# Patient Record
Sex: Female | Born: 1960 | Race: White | Hispanic: No | Marital: Married
Health system: Midwestern US, Community
[De-identification: ages and names within clinical notes are randomized; demographics above are authoritative.]

## PROBLEM LIST (undated history)

## (undated) DIAGNOSIS — R928 Other abnormal and inconclusive findings on diagnostic imaging of breast: Secondary | ICD-10-CM

## (undated) DIAGNOSIS — N63 Unspecified lump in unspecified breast: Secondary | ICD-10-CM

## (undated) DIAGNOSIS — Z1231 Encounter for screening mammogram for malignant neoplasm of breast: Secondary | ICD-10-CM

## (undated) DIAGNOSIS — C50412 Malignant neoplasm of upper-outer quadrant of left female breast: Principal | ICD-10-CM

---

## 2015-06-12 ENCOUNTER — Encounter

## 2015-06-13 ENCOUNTER — Encounter

## 2015-06-13 ENCOUNTER — Inpatient Hospital Stay: Admit: 2015-06-12

## 2015-06-13 DIAGNOSIS — Z1231 Encounter for screening mammogram for malignant neoplasm of breast: Secondary | ICD-10-CM

## 2015-06-18 ENCOUNTER — Inpatient Hospital Stay: Admit: 2015-06-18

## 2015-06-18 ENCOUNTER — Encounter

## 2015-06-18 DIAGNOSIS — R928 Other abnormal and inconclusive findings on diagnostic imaging of breast: Secondary | ICD-10-CM

## 2015-06-19 ENCOUNTER — Encounter

## 2015-06-29 ENCOUNTER — Encounter

## 2015-06-29 ENCOUNTER — Inpatient Hospital Stay: Admit: 2015-06-29

## 2015-06-29 DIAGNOSIS — R928 Other abnormal and inconclusive findings on diagnostic imaging of breast: Secondary | ICD-10-CM

## 2015-06-29 DIAGNOSIS — N63 Unspecified lump in unspecified breast: Secondary | ICD-10-CM

## 2015-07-13 ENCOUNTER — Institutional Professional Consult (permissible substitution): Admit: 2015-07-13 | Discharge: 2015-07-13 | Payer: PRIVATE HEALTH INSURANCE | Attending: Surgery

## 2015-07-13 DIAGNOSIS — Z7689 Persons encountering health services in other specified circumstances: Secondary | ICD-10-CM

## 2015-07-15 NOTE — Progress Notes (Signed)
Patient Name: Karen Bright  Date of Birth:   1960-09-17  Primary Care Physician: Gilman Schmidt, NP    Chief Complaint:  Patient presents secondary to newly diagnosed left breast cancer.  She underwent her annual screening mammogram at which time the lesion was noted.  She has never undergone breast imaging before.  She states she has no complaints related to her breasts prior to her imaging.   She denies any bilateral palpable masses/lesions.  She denies any bilateral skin changes/erythema/thickening/dimpling.  She denies any nipple changes/retraction or discharge bilaterally.  She underwent two biopsies at the time of her biopsy.  A ultrasound guided core biopsy which was returned as cancer and a stereotactic biopsy which was returned benign.  In addition, at the time of her imaging she was found to have abnormal right breast imaging and a six month follow up was recommended.    She has no family history of breast and/or ovarian cancer.  But she does have an extensive family history of prostate cancer.  Prostate cancer history: father age 76, brother age 37, brother age 57, 2 paternal uncles age 84's-80's       Breast History:  Personal History of Breast Cancer: no  History of High Risk Lesion: no  History of Benign Breast Lesion:no  History of Previous Breast Biopsy:no  Self Breast Exams Completed:yes-monthly  Family History of Breast Cancer:no  Family History of Other Cancers:yes-see above; mother sarcoma age 78  Ashkenazi Jewish Decent:no  Bra Size: 25 DD    Gyne History:  Gravida: 2, Para: 0, Spont: 0, Therapeutic: 2  Age of Menarche: 73 years  Age of Menopause: 2013 years   LMP: N/A  Are periods regular: N/A  Age of first pregnancy: N/A  Age of first live Birth: N/A  Breast Feeding (total time): N/A  History of Hysterectomy / TAH-BSO: N0  History of OCP's/HRT:No  Last PAP: 2014  History of Ovarian Cancer: no  Family History of Ovarian Cancer: no  History of Gyne Surgery: no    No past medical history on  file.    No past surgical history on file.       No current outpatient prescriptions on file.     No current facility-administered medications for this visit.        Social History     Social History   ??? Marital status: Married     Spouse name: N/A   ??? Number of children: N/A   ??? Years of education: N/A     Occupational History   ??? Not on file.     Social History Main Topics   ??? Smoking status: Former Smoker     Types: Cigarettes     Quit date: 1985   ??? Smokeless tobacco: Never Used   ??? Alcohol use Not on file   ??? Drug use: Not on file   ??? Sexual activity: Not on file     Other Topics Concern   ??? Not on file     Social History Narrative   ??? No narrative on file       OBJECTIVE:    Vitals:   BP 112/78 (Site: Left Arm)   Ht 5' 5"  (1.651 m)   Wt 219 lb (99.3 kg)   Breastfeeding? No   BMI 36.44 kg/m2            ROS  Constitutional: no weight loss, fever, night sweats   Skin: negative  Cardiovascular: no chest pain  or palpitations   Pulmonary: No cough, sputum, or hemoptysis   GI:No abdominal pain  Breast: see above  All other systems were reviewed and are negative    Exam  Breast: Bilateral breasts are symmetrical. The bilateral nipples are everted without discharge. The skin bilaterally is without erythema/thickening (peau d'orange)/dimpling. There are no palpable masses/lesions in the right breast.  In the left breast at the 1:30 o'clock position approximately 8 cm from the nipple is a palpable mass measuring 1 x 1 cm.  There is a well healed biopsy incision at the 1 o'clock position 9 cm form the nipple.    Lymphatics: no palpable cervical, supraclavicular, or bilateral axillary lymphadenopathy     General appearance: alert, well appearing, and in no distress  Skin: normal coloration and turgor, no rashes, no suspicious skin lesion noted  Neck: supple, no thyromegaly  Lungs: clear to auscultation, no wheezes rales or rhonchi, symmetric air entry  Heart: normal rate, regular rhythm, normal S1, S2, no murmurs, rubs,  clicks or gallops  Abdomen: soft, non-tender, non-distended, no masses or organomegaly  Extremities: bilateral upper extremities without edema    Pathology:  FINAL DIAGNOSIS:    ???? A. Left breast tissue, stereotactic core biopsy for indeterminate  ???? ?? calcifications:  ???? - Fibrocystic changes, primarily variably prominent stromal fibrosis,  ???? ?? with numerous and focally concentrated classic acinar  ???? ?? microcalcifications (negative for malignancy and significant  ???? ?? atypia).    ???? B. Left breast tissue, core biopsy of 7 mm nodule at 2:00, 8 cm from  ???? ?? nipple:  ???? - Invasive ductal carcinoma; see Comment.    COMMENT: ??The carcinoma invades several of the cores and approximately  50% of the overall sample, primarily as variably interconnected and/or  irregular nests of cells, with a tiny minority population of tubules, in  a focally desmoplastic/reactive/fibroelastotic background. Grade 2 nuclei  and negligible mitoses confer an overall grade 2 appearance, with formal  grading recommended on excision, if performed. A minor component of  cytologically similar-appearing ductal carcinoma in situ (DCIS) cannot be  completely excluded on H&E stains. A full panel of breast biomarkers will  be performed on block B3, those results to be reported as a supplement.  Slide B-3-1 has been prospectively reviewed by another member of the  Pathology Department. ??BRATI/BRATI    ADDENDUM:  BREAST CANCER BIOMARKERS  Below are results for prognostic/predictive factors on this patient's  breast cancer. All factors were assessed semiquantitatively by  immunohistochemistry (IHC).    TUMOR SITE/BLOCK: B3    ESTROGEN RECEPTOR: Positive  ???? ?? Proportion score = 5/5  ???? ?? Intensity score = 2-3/3 (most nuclei 3+ of 3)    PROGESTERONE RECEPTOR: Positive  ???? ?? Proportion score = 5/5  ???? ?? 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 Clinically, she is Stage 1 T1 , N0, Mx. I explained this may change based on pathology, but this is the clinical staging with the current  information available.  3.Status post left breast ultrasound guided core biopsy 06/29/15  4.Status post left breast stereotactic guided core biopsy 06/29/15 - benign  5.Abnormal right breast imaging    Plan:  1. I met with the patient and her friend and discussed extensively surgical and treatment options, spending over 90 minutes with them. I used diagrams to demonstrate how breast cancer develops. The patient was given a "patient guide" binder which was also reviewed in our consultation today.    We discussed partial mastectomy (lumpectomy/ breast conserving surgery) combined with post-op radiation therapy, vs mastectomy with and without reconstruction. We reviewed the risks (including but not limited to: bleeding, infection, recurrence, missing lesion, damage to surrounding structures, need for further surgery, continued pain, poor/non wound healing, scar, deformity of breast and/or nipple, nipple necrosis) and benefits of the surgeries.  I also explained the potential need for additional surgery if margins were not clear on partial mastectomy.  We also discussed combining partial mastectomy with bilateral breast reduction surgery.      I explained the indications and benefits/risks of sentinel lymph node biopsy (SLNB). I also discussed the possible morbidities related to lymph node surgery (including but not limited to: bleeding, infection, damage to surrounding structures, need  for further surgery, pain, paresthesias, numbness, shoulder dysfunction, arm dysfunction, scapular dysfunction, range of motion problems, lymphedema) with the possible need for axillary lymph node dissection pending the SLNB results.       We briefly discussed the possible indications for chemotherapy post-operatively based on final pathology results, as well as the recommendation for hormone therapy with estrogen positive disease receptor post-operatively. I explained I will plan for her to see medical oncologist, Dr. Caleen Essex.        I also explained the need for radiation therapy should a partial mastectomy be completed at the time of surgery.  We also reviewed the most common indications for post mastectomy radiation therapy including positive lymph node disease, large tumors (greater than 5 cm), and margin involvement.At this time the patient feels she most likely will undergo bilateral mastectomy.  Therefore, a referral to radiation oncology will be placed in the future should one be required.    I have recommended the patient undergo genetic counseling and possibly testing for BRCA mutation secondary to her family history of prostate cancer. I have discussed with the patient that as high as 10% of breast cancers may have a genetic component. And, in addition having a mutation in the BRCA gene also places her at risk for the development of ovarian cancer. I have also informed her that this may alter her surgical decision planning therefore, if she would like to pursue genetic testing and it would in any way alter her surgical decision making I would recommend this be completed prior to her breast surgery.      She has opted for mastectomy and sentinel lymph node biopsy, which has been scheduled after speaking with plastic surgery as the patient will have immediate first stage reconstruction at the time of her mastectomies.      Finally, the patient was provided with local support group information for  her use should she choose.    2.  Referral to medical oncology, Caleen Essex    3.  Referral to genetics    4.  Referral to plastic surgery, Allision Lied    5.  Oncotype Dx has been sent.  I have explained to the patient that there is a test called Oncotype Dx which  can be completed on her tumor to help the medical oncologist to determine her need for chemotherapy.  I have explained the text and that it determines her a recurrence score which the medical oncologist can use along with other information to help make recommendations for her therapy.  The patient is agreeable to having this test completed.      6.  MRI ordered    7.  Right mammogram and ultrasound will be needed in six months    8.  Upper extremity measurements were taken and documented    9.  Patient will need medical clearance for surgery    10.  Patient was given a pre-operative instruction sheet which was reviewed with her by nursing/MA    11.  Return to clinic two weeks post op, sooner if needed    12. Continue yearly mammograms    13.  Continue monthly self breast exams    14  Thank you for allowing me to participate in the care of your patient.       A total of 90 minutes was spent with this patient, 80 minutes were spent counseling

## 2015-07-16 NOTE — Telephone Encounter (Signed)
NP DX. DR. Kary Kos [phone] [facility] MRI SCHEDULED 06.14.2017 BIOPS 05.19.2017 MAMMO 05.03.2017 05.09.2017 ALL WITH REFERRING MD. PATIENT GAVE VERBAL CONSENT TO OBTAIN RECORDS. PACKET MAILED. PATIENT REQUESTED LOCATION/DATE. APPOINTMENT 06.20.2017 AT 11:00. Karen Bright

## 2015-07-16 NOTE — Telephone Encounter (Signed)
Noted  

## 2015-07-25 ENCOUNTER — Inpatient Hospital Stay: Admit: 2015-07-25 | Attending: Surgery

## 2015-07-25 DIAGNOSIS — C50412 Malignant neoplasm of upper-outer quadrant of left female breast: Secondary | ICD-10-CM

## 2015-07-25 MED ORDER — GADOTERIDOL 279.3 MG/ML IV SOLN
279.3 MG/ML | Freq: Once | INTRAVENOUS | Status: AC | PRN
Start: 2015-07-25 — End: 2015-07-25
  Administered 2015-07-25: 21:00:00 20 mL via INTRAVENOUS

## 2015-07-30 NOTE — Telephone Encounter (Signed)
Records are in the patients chart.  The 06.14.17 MRI is in process, per [facility].

## 2015-07-31 ENCOUNTER — Ambulatory Visit: Admit: 2015-07-31 | Discharge: 2015-07-31 | Payer: PRIVATE HEALTH INSURANCE | Attending: Surgery

## 2015-07-31 ENCOUNTER — Inpatient Hospital Stay: Attending: Surgery

## 2015-07-31 ENCOUNTER — Ambulatory Visit: Admit: 2015-07-31 | Discharge: 2015-07-31 | Payer: PRIVATE HEALTH INSURANCE | Attending: Medical Oncology

## 2015-07-31 DIAGNOSIS — C50412 Malignant neoplasm of upper-outer quadrant of left female breast: Secondary | ICD-10-CM

## 2015-07-31 DIAGNOSIS — Z7189 Other specified counseling: Secondary | ICD-10-CM

## 2015-07-31 NOTE — Telephone Encounter (Signed)
Pt says that she has a few questions regarding her surgery. Pt wants to know if she can stop by the office today to speak with someone since she has an oncology appointment and will be in the area, please contact pt.

## 2015-07-31 NOTE — Progress Notes (Signed)
HEMATOLOGY/ONCOLOGY CONSULTATION:     08/05/2015 1:59 PM    REASON FOR CONSULT:     PROVIDERS:  Gilman Schmidt, NP    CHIEF COMPLAINT:     Chief Complaint   Patient presents with   ??? 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 Clinically, she is Stage 1 T1 , N0, Mx. Status post left breast ultrasound guided core biopsy 06/29/15.  Status post left breast stereotactic guided core biopsy 06/29/15 - benign.  Oncotype Dx 10 .  Plan for definitive surgery 08/09/15.  Plan for bilateral mastectomy with immediate reconstruction with Dr Candice Camp  Will have a SNLB as well    ??  PAST MEDICAL HISTORY:     Past Medical History:   Diagnosis Date   ??? Cancer ([place])    ??? COPD (chronic obstructive pulmonary disease) ([place])    ??? COPD, mild ([facility])-dx PFT's [place] 2017 08/03/2015       PAST SURGICAL HISTORY:        Past Surgical History:   Procedure Laterality Date   ??? APPENDECTOMY  1971   ??? BREAST BIOPSY  2017   ??? TOE AMPUTATION Right 2013    right great toe with node removal       SOCIAL HISTORY:     Social History     Social History   ??? Marital status: Married     Spouse name: N/A   ??? Number of children: N/A   ??? Years of education: N/A     Occupational History   ??? Not on file.     Social History Main Topics   ??? Smoking status: Former Smoker     Types: Cigarettes     Quit date: 1985   ??? Smokeless tobacco: Never Used   ??? Alcohol use Yes   ??? Drug use: No   ??? Sexual activity: Not on file     Other Topics Concern   ??? Not on file     Social History Narrative    Exercises regularly        Lives in [place]:     Family History   Problem Relation Age of Onset   ??? Diabetes Mother    ??? Cancer Mother      Breast   ??? Cancer Father 52     Prostate   ??? Cancer Brother 32     Prostate   ??? Cancer Maternal Grandmother 102     Colon    ??? Other Maternal Grandfather 70      Emphysema   ??? Cancer Brother 33     Prostate and Melanoma   ??? Cancer Brother      Melanoma       ALLERGIES:     Allergies as of 07/31/2015   ??? (No Known Allergies)       MEDICATIONS:     No current outpatient prescriptions on file prior to visit.     No current facility-administered medications on file prior to visit.        ROS:     Pertinent ROS in HPI  Otherwise 10 point ROS in normal      PHYSICAL EXAM:       BP 118/80   Pulse 60  Temp 98.6 ??F (37 ??C)   Resp 12   Ht 5' 4.96" (1.65 m)   Wt 217 lb 6.4 oz (98.6 kg)   BMI 36.22 kg/m2    General appearance: alert and cooperative  Head: Normocephalic, without obvious abnormality, atraumatic no scleral icterus  Neck: No palpable lymphadenopathy in supraclavicular or cervical chains  Lungs: Clear to auscultation bilaterally, no audible rales, wheezes or crackles  Heart: Regular rate and rhythm, S1, S2 normal no JVD  Abdomen: Soft, non-tender; bowel sounds normal; no masses,  no organomegaly  Extremities: without cyanosis, clubbing, edema or asymmetry  Skin: No jaundice, purpura or petechiae  Neuro: A/Ox3, gait normal ,neurologically intact  R great toe amputation     LABS:     Lab Results   Component Value Date    WBC 8.7 08/03/2015    HGB 14.7 08/03/2015    HCT 45.0 08/03/2015    MCV 90.5 08/03/2015    PLT 232 08/03/2015       Lab Results   Component Value Date    GLUCOSE 94 08/03/2015    BUN 12 08/03/2015    CREATININE 0.6 08/03/2015    K 4.7 08/03/2015       Lab Results   Component Value Date    ALKPHOS 75 08/03/2015    AST 18 08/03/2015       No results found for: MG    No results found for: URICACID    No results found for: LDH    No components found for: PT,  INR    No results found for: IRON, TIBC, FERRITIN    PATHOLOGY         ???? A. Left breast tissue, stereotactic core biopsy for indeterminate  ???? ?? calcifications:  ???? - Fibrocystic changes, primarily variably prominent stromal fibrosis,  ???? ?? with numerous and focally concentrated classic acinar  ???? ??  microcalcifications (negative for malignancy and significant  ???? ?? atypia).    ???? B. Left breast tissue, core biopsy of 7 mm nodule at 2:00, 8 cm from  ???? ?? nipple:  ???? - Invasive ductal carcinoma; see Comment.    COMMENT: ??The carcinoma invades several of the cores and approximately  50% of the overall sample, primarily as variably interconnected and/or  irregular nests of cells, with a tiny minority population of tubules, in  a focally desmoplastic/reactive/fibroelastotic background. Grade 2 nuclei  and negligible mitoses confer an overall grade 2 appearance, with formal  grading recommended on excision, if performed. A minor component of  cytologically similar-appearing ductal carcinoma in situ (DCIS) cannot be  completely excluded on H&E stains. A full panel of breast biomarkers will  be performed on block B3, those results to be reported as a supplement.  Slide B-3-1 has been prospectively reviewed by another member of the  Pathology Department. ??BRATI/BRATI   ADDENDUM:  BREAST CANCER BIOMARKERS  Below are results for prognostic/predictive factors on this patient's  breast cancer. All factors were assessed semiquantitatively by  immunohistochemistry (IHC).    TUMOR SITE/BLOCK: B3    ESTROGEN RECEPTOR: Positive  ???? ?? Proportion score = 5/5  ???? ?? Intensity score = 2-3/3 (most nuclei 3+ of 3)    PROGESTERONE RECEPTOR: Positive  ???? ?? Proportion score = 5/5  ???? ?? Intensity score = 3/3    ERBB2/HER2/neu: Negative; score = 1+ (weak) of 3    MIB1 PROLIFERATION RATE: Borderline increased (score = 25%)  ??  ????  IMAGING:     No  results found.    STAGING:     Breast cancer of upper-outer quadrant of left female breast ([place])    Staging form: Breast, AJCC 7th Edition    - Clinical: Stage IA (T1c, N0, M0) - Unsigned    ASSESSMENT Kathyrn Lass:       Encounter Diagnoses   Name Primary?   ??? Breast cancer of upper-outer quadrant of left female breast ([place]) Yes   ??? ER+ (estrogen receptor positive status)        Left breast cancer T1 cN0  M0 stage I A ER PR positive HER-2/neu negative Oncotype DX score below 10.  She will proceed with surgery the following weeks.  Patient return after surgery discuss adjuvant endocrine therapy.  There is no role for chemotherapy given her low Oncotype DX.  She is having a mastectomy so not likely need adjuvant radiation therapy.  Did discuss briefly endocrine therapy in terms of side effects but will discuss to discuss further upon her return after surgery.    Would refer to genetic counselor given her family history and personal history of ALM  melanoma or right great toe               Hazle Quant, MD    ??  This chart was generated in part by using Dragon Dictation system and may contain errors related to that system including errors in grammar, punctuation, and spelling, as well as words and phrases that may be inappropriate. If there are any questions or concerns please feel free to contact the dictating provider for clarification.   ??

## 2015-07-31 NOTE — Telephone Encounter (Signed)
Pt added on the schedule for today @ 1:50pm

## 2015-08-01 NOTE — Progress Notes (Signed)
Patient presents for consultation only.  She has several questions related to mastectomy and surgical course  Spent 30 minutes with patient answering her questions.  All questions answered  Patient scheduled for surgery next week.  She had questions related to mastectomy and sentinel lymph node biopsy.  Patient is going to proceed with bilateral mastectomy and sentinel lymph node biopsy followed by reconstruction to be completed by Dr. Candice Camp.    A total of 30 minutes was spent face to face/involved with this patient, All spent counseling

## 2015-08-02 NOTE — Progress Notes (Signed)
[facility] / [facility] [address] [place], [place] [address]    Acknowledgment of Informed Consent for Surgical or Medical Procedure and Sedation  I agree to allow doctor(s) [place] and his/her associates or assistants, including residents and/or other qualified medical practitioner to perform the following medical treatment or procedure and to administer or direct the administration of sedation as necessary:  Procedure(s): BILATERAL BREAST MASTECTOMY, LEFT BREAST SENTINEL [place], STAGE 1 RECONSTRUCTION BILATERAL TISSUE EXPANDERS WITH ALLODERM AND ON Q [place]  My doctor has explained the following regarding the proposed procedure:  ??? the explanation of the procedure  ??? the benefits of the procedure  ??? the potential problems that might occur during recuperation  ??? the risks and side effects of the procedure which could include but are not limited to severe blood loss, infection, stroke or death  ??? the benefits, risks and side effect of alternative procedures including the consequences of declining this procedure or any alternative procedures  ??? the likelihood of achieving satisfactory results.  I acknowledge no guarantee or assurance has been made to me regarding the results.    I understand that during the course of this treatment/procedure, unforeseen conditions can occur which require an additional or different procedure.  I agree to allow my physician or assistants to perform such extension of the original procedure as they may find necessary.    I understand that sedation will often result in temporary impairment of memory and fine motor skills and that sedation can occasionally progress to a state of deep sedation or general anesthesia.    I understand the risks of anesthesia for surgery include, but are not limited to, sore throat, hoarseness, injury to face, mouth, or teeth; nausea; headache;  injury to blood vessels or nerves; death, brain damage, or paralysis.    I understand that if I have a Limitation of Treatment order in effect during my hospitalization, the order may or may not be in effect during this procedure.     I give my doctor permission to give me blood or blood products.  I understand that there are risks with receiving blood such as hepatitis, AIDS, fever, or allergic reaction.  I acknowledge that the risks, benefits, and alternatives of this treatment have been explained to me and that no express or implied warranty has been given by the hospital, any blood bank, or any person or entity as to the blood or blood components transfused.    At the discretion of my doctor, I agree to allow observers, equipment/product representatives and allow photographing, and/or televising of the procedure, provided my name or identity is maintained confidentially.      I agree the hospital may dispose of or use for scientific or educational purposes any tissue, fluid, or body parts which may be removed.    ________________________________Date________Time______ am/pm  ([place])  Patient or Signature of Closest Relative or Legal Guardian    ________________________________Date________Time______am/pm      Page 1 of ____  Witness

## 2015-08-03 ENCOUNTER — Ambulatory Visit: Admit: 2015-08-03 | Discharge: 2015-08-03 | Payer: PRIVATE HEALTH INSURANCE | Attending: Internal Medicine

## 2015-08-03 DIAGNOSIS — Z01818 Encounter for other preprocedural examination: Secondary | ICD-10-CM

## 2015-08-03 NOTE — Progress Notes (Signed)
Karen Bright is a 55 y.o.  female  Presenting today for a preop visit.  she has scheduled bilateral mastectomy  Procedure indicated for breast cancer  Procedure will  be performed by Dr. Kary Kos  on 08/09/2015 at [facility]    No Known Allergies          Past Surgical History:   Procedure Laterality Date   ??? APPENDECTOMY  1971   ??? BREAST BIOPSY  2017   ??? TOE AMPUTATION Right 2013    right great toe with node removal       Patient Active Problem List   Diagnosis   ??? Breast cancer of upper-outer quadrant of left female breast ([place])       Previous steroids no  Personal or family hx of prolonged bleeding no  Personal or family hx of adverse reaction to anesthesia no  Immunization deficiency none    Patient's medications, allergies, past medical, surgical, social and family histories were reviewed and updated as appropriate.    Complete ROS negative    BP 100/68 (Site: Right Arm, Position: Sitting, Cuff Size: Large Adult)   Pulse 83   Temp 98.5 ??F (36.9 ??C) (Oral)    Ht 5' 2.99" (1.6 m)   Wt 217 lb (98.4 kg)   SpO2 93%   BMI 38.45 kg/m2  Gen: well nourished, comfortable  Nose: Normal mucosa, no deformity   Mouth/Throat: Oropharynx is clear and moist, and mucous membranes are normal. Normal dentition, no concerning lesions  Eyes: Conjunctivae and extraocular motions are normal. Pupils are equal, round, and reactive to light.   Ears: normal canals and TM's bilaterally  Neck: Neck supple. No mass  Lymph: no cervical or supraclavicular adenopathy  Cardiovascular: S1S2 with no murmurs, rubs or gallops  Pulmonary/Chest: Effort normal and breath sounds normal. . She has no wheezes, rhonchi or rales.   Abdominal: Soft, non-tender. Bowel sounds are normal. She exhibits no organomegaly or mass  Genitourinary: deferred  Musculoskeletal: Normal range of motion, no synovitis.  Neurological:  CN 2-12 grossly intact   Coordination normal. Normal tone  Skin: Skin is warm and dry. There is no rash or erythema.  No  suspicious lesions noted.   Psychiatric: behavior appropriate for age     Assessment and Plan    Low risk patient may proceed with moderate risk procedure  No nsaids for 7 days prior to surgery.  Nothing but tylenol  ecg with poor rwave progression,  NSR  Vigorous exercise regularly 7 MET-no cardiac concerns

## 2015-08-04 LAB — COMPREHENSIVE METABOLIC PANEL
ALT: 21 U/L (ref 10–40)
AST: 18 U/L (ref 15–37)
Albumin/Globulin Ratio: 2.1 (ref 1.1–2.2)
Albumin: 4.9 g/dL (ref 3.4–5.0)
Alkaline Phosphatase: 75 U/L (ref 40–129)
Anion Gap: 17 — ABNORMAL HIGH (ref 3–16)
BUN: 12 mg/dL (ref 7–20)
CO2: 25 mmol/L (ref 21–32)
Calcium: 10.1 mg/dL (ref 8.3–10.6)
Chloride: 98 mmol/L — ABNORMAL LOW (ref 99–110)
Creatinine: 0.6 mg/dL (ref 0.6–1.1)
GFR African American: >60 (ref >60)
GFR Non-African American: >60 (ref >60)
Globulin: 2.3 g/dL
Glucose: 94 mg/dL (ref 70–99)
Potassium: 4.7 mmol/L (ref 3.5–5.1)
Sodium: 140 mmol/L (ref 136–145)
Total Bilirubin: 0.3 mg/dL (ref 0.0–1.0)
Total Protein: 7.2 g/dL (ref 6.4–8.2)

## 2015-08-04 LAB — CBC WITH AUTO DIFFERENTIAL
Basophils %: 0.5 %
Basophils Absolute: 0 10*3/uL (ref 0.0–0.2)
Eosinophils %: 0.4 %
Eosinophils Absolute: 0 10*3/uL (ref 0.0–0.6)
Hematocrit: 45 % (ref 36.0–48.0)
Hemoglobin: 14.7 g/dL (ref 12.0–16.0)
Lymphocytes %: 27.7 %
Lymphocytes Absolute: 2.4 10*3/uL (ref 1.0–5.1)
MCH: 29.6 pg (ref 26.0–34.0)
MCHC: 32.7 g/dL (ref 31.0–36.0)
MCV: 90.5 fL (ref 80.0–100.0)
MPV: 9.7 fL (ref 5.0–10.5)
Monocytes %: 6.7 %
Monocytes Absolute: 0.6 10*3/uL (ref 0.0–1.3)
Neutrophils %: 64.7 %
Neutrophils Absolute: 5.6 10*3/uL (ref 1.7–7.7)
Platelets: 232 10*3/uL (ref 135–450)
RBC: 4.97 M/uL (ref 4.00–5.20)
RDW: 13.5 % (ref 12.4–15.4)
WBC: 8.7 10*3/uL (ref 4.0–11.0)

## 2015-08-07 ENCOUNTER — Encounter

## 2015-08-07 ENCOUNTER — Inpatient Hospital Stay: Admit: 2015-08-07 | Attending: Surgery

## 2015-08-07 DIAGNOSIS — C50412 Malignant neoplasm of upper-outer quadrant of left female breast: Secondary | ICD-10-CM

## 2015-08-08 ENCOUNTER — Inpatient Hospital Stay: Admit: 2015-08-08 | Attending: Surgery

## 2015-08-08 NOTE — Progress Notes (Signed)
We have all necessary testing for patient

## 2015-08-08 NOTE — Anesthesia Pre-Procedure Evaluation (Signed)
ANESTHESIA PRE-OP NOTE    NAME: Karen Bright  DOB: 1960/05/07  AGE: 55 y.o.  MED. REC. #: [id number]  DOS: 08/09/15  TIME: 8:30     PROCEDURE:  BILATERAL BREAST MASTECTOMY, LEFT BREAST SENTINEL LYMPH NODE BIOPSY POSSIBLE AXILLARY LYMPH NODE DISSECTION, STAGE 1 RECONSTRUCTION BILATERAL TISSUE EXPANDERS WITH ALLODERM   SURGEON: Shapiro-Wright / Lied     ALLERGIES: none     HT: 5'3"   WT: 217 lbs    MEDICATIONS:   TRIAMCINOLONE   ASA STATUS: 2  NPO since: midnight   Patient identified/chart reviewed: yes     PSH:  Appendectomy (1971); Breast biopsy (2017); and Toe amputation (Right, 2013).    PERSONAL/FAMILY ANESTHESIA PROBLEMS: ( - )       CV: EKG-->NORMAL SINUS RHYTHM; OCCASIONAL PALPITATIONS (ECHO-->NO SIGNIFICANT ABNORMALITIES PULMONARY: MILD COPD-->noted on spirometry, minimal symptoms; FORMER SMOKER   GI/HEPATIC: ( - ) GERD  ENDOCRINE: ( - ) DM   NEURO/PSYCH: ALERT AND ORIENTED GU: ( - )    MUSCULOSKELETAL: ( - )  OTHER: OBESITY   HEME/ONC: LEFT BREAST CANCER COMMENTS:     AIRWAY ASSESSMENT:  MALLAMPATI: 2 DENTITION: INTACT ROM: FULL     ANESTHETIC PLAN: GENERAL with IV Induction   CONSENT: Risks/benefits/options/questions discussed with patient and/or patient representative.Consent obtained: Rupert Stacks, M.D. August 09, 2015  6:44 AM

## 2015-08-08 NOTE — Progress Notes (Signed)
PRE-OP INSTRUCTIONS FOR THE SURGICAL PATIENT YOU ARE UNABLE TO MAKE CONTACT FOR AN INTERVIEW:      1. Follow instructions for your ARRIVAL TIME as DIRECTED BY YOUR SURGEON.   2. Enter the MAIN entrance located on [address] and report to the desk.   3. Bring your insurance & prescription card and photo ID with you.    4. Leave all other valuables at home.               5. Arrange for someone to drive you home and be with you for the first 24 hours after discharge.  6. You must contact your surgeon for ALL medication instructions, especially if taking blood thinners, aspirin, or diabetic medication.  7. A Pre-op History and Physical for surgery MUST be completed by your Physician or an Urgent Care within 30 days of your procedure date.  Please bring a copy with you on the day of your procedure and along with any other testing performed.  8. DO NOT EAT OR DRINK ANYTHING AFTER MIDNIGHT, including gum, candy, mints or ice chips   9. Dress in loose, comfortable clothing appropriate for redressing after your procedure. Do not wear jewelry (including body piercings), make-up, fingernail polish, lotion, powders or metal hairclips. Contacts will need to be removed prior to surgery.  10. If you use a CPAP, please bring it with you on the day of your procedure.  11. Do not shave or wax for 72 hours prior to procedure near your operative site  12. FOR WOMAN OF CHILDBEARING AGE ONLY- please bring a urine sample with you on day of surgery or make sure we can collect on arrival.    If you have further questions, you may contact us at [phone]    Left instructions on patient's voicemail.    Vivi Martens.08/08/2015 .1:33 PM

## 2015-08-09 ENCOUNTER — Observation Stay: Admit: 2015-08-09

## 2015-08-09 ENCOUNTER — Inpatient Hospital Stay
Admit: 2015-08-09 | Discharge: 2015-08-10 | Disposition: A | Discharge status: Home / Self Care | Payer: PRIVATE HEALTH INSURANCE | Source: Ambulatory Visit | Attending: Surgery | Admitting: Surgery

## 2015-08-09 ENCOUNTER — Encounter: Admit: 2015-08-09 | Payer: PRIVATE HEALTH INSURANCE | Attending: Surgery

## 2015-08-09 DIAGNOSIS — C50912 Malignant neoplasm of unspecified site of left female breast: Principal | ICD-10-CM

## 2015-08-09 MED ORDER — NORMAL SALINE FLUSH 0.9 % IV SOLN
0.9 % | Freq: Two times a day (BID) | INTRAVENOUS | Status: DC
Start: 2015-08-09 — End: 2015-08-09

## 2015-08-09 MED ORDER — FENTANYL CITRATE (PF) 250 MCG/5ML IJ SOLN
250 | INTRAMUSCULAR | Status: AC
Start: 2015-08-09 — End: 2015-08-09

## 2015-08-09 MED ORDER — HYDROMORPHONE HCL 1 MG/ML IJ SOLN
1 MG/ML | INTRAMUSCULAR | Status: DC | PRN
Start: 2015-08-09 — End: 2015-08-09
  Administered 2015-08-09: 19:00:00 0.5 mg via INTRAVENOUS

## 2015-08-09 MED ORDER — PROPOFOL 200 MG/20ML IV EMUL
200 | INTRAVENOUS | Status: AC
Start: 2015-08-09 — End: 2015-08-09

## 2015-08-09 MED ORDER — HYDROMORPHONE HCL 1 MG/ML IJ SOLN
1 | INTRAMUSCULAR | Status: DC | PRN
Start: 2015-08-09 — End: 2015-08-10

## 2015-08-09 MED ORDER — MEPERIDINE HCL 25 MG/ML IJ SOLN
25 MG/ML | INTRAMUSCULAR | Status: DC | PRN
Start: 2015-08-09 — End: 2015-08-09

## 2015-08-09 MED ORDER — LUBRIFRESH P.M. OP OINT
OPHTHALMIC | Status: AC
Start: 2015-08-09 — End: 2015-08-09

## 2015-08-09 MED ORDER — SUGAMMADEX SODIUM 200 MG/2ML IV SOLN
200 | INTRAVENOUS | Status: AC
Start: 2015-08-09 — End: 2015-08-09

## 2015-08-09 MED ORDER — LIDOCAINE-PRILOCAINE 2.5-2.5 % EX CREA
CUTANEOUS | Status: AC
Start: 2015-08-09 — End: 2015-08-09
  Administered 2015-08-09: 11:00:00 via TOPICAL

## 2015-08-09 MED ORDER — LACTATED RINGERS IV SOLN
INTRAVENOUS | Status: DC
Start: 2015-08-09 — End: 2015-08-09

## 2015-08-09 MED ORDER — GENTAMICIN SULFATE 40 MG/ML IJ SOLN
40 | INTRAMUSCULAR | Status: AC
Start: 2015-08-09 — End: 2015-08-09

## 2015-08-09 MED ORDER — OXYCODONE-ACETAMINOPHEN 5-325 MG PO TABS
5-325 | ORAL | Status: DC | PRN
Start: 2015-08-09 — End: 2015-08-10
  Administered 2015-08-10 (×2): 1 via ORAL

## 2015-08-09 MED ORDER — LIDOCAINE-PRILOCAINE 2.5-2.5 % EX CREA
CUTANEOUS | Status: DC | PRN
Start: 2015-08-09 — End: 2015-08-09

## 2015-08-09 MED ORDER — DEXTROSE 5 % IV SOLN (MINI-BAG)
5 | Freq: Three times a day (TID) | INTRAVENOUS | Status: DC
Start: 2015-08-09 — End: 2015-08-10
  Administered 2015-08-09 – 2015-08-10 (×3): 1 g via INTRAVENOUS

## 2015-08-09 MED ORDER — ONDANSETRON HCL 4 MG/2ML IJ SOLN
4 | INTRAMUSCULAR | Status: AC
Start: 2015-08-09 — End: 2015-08-09

## 2015-08-09 MED ORDER — CEFAZOLIN SODIUM 1 G IJ SOLR
1 | INTRAMUSCULAR | Status: AC
Start: 2015-08-09 — End: 2015-08-09

## 2015-08-09 MED ORDER — SODIUM CHLORIDE 0.9 % IJ SOLN
0.9 | INTRAMUSCULAR | Status: AC
Start: 2015-08-09 — End: 2015-08-09

## 2015-08-09 MED ORDER — SUCCINYLCHOLINE CHLORIDE 20 MG/ML IJ SOLN
20 | INTRAMUSCULAR | Status: AC
Start: 2015-08-09 — End: 2015-08-09

## 2015-08-09 MED ORDER — LIDOCAINE HCL (PF) 1 % IJ SOLN
1 % | Freq: Once | INTRAMUSCULAR | Status: DC | PRN
Start: 2015-08-09 — End: 2015-08-09

## 2015-08-09 MED ORDER — LACTATED RINGERS IV SOLN
INTRAVENOUS | Status: DC
Start: 2015-08-09 — End: 2015-08-10
  Administered 2015-08-09 – 2015-08-10 (×3): via INTRAVENOUS

## 2015-08-09 MED ORDER — OXYCODONE-ACETAMINOPHEN 5-325 MG PO TABS
5-325 | ORAL | Status: DC | PRN
Start: 2015-08-09 — End: 2015-08-10
  Administered 2015-08-09 – 2015-08-10 (×3): 2 via ORAL

## 2015-08-09 MED ORDER — NORMAL SALINE FLUSH 0.9 % IV SOLN
0.9 % | INTRAVENOUS | Status: DC | PRN
Start: 2015-08-09 — End: 2015-08-09

## 2015-08-09 MED ORDER — ONDANSETRON HCL 4 MG/2ML IJ SOLN
4 MG/2ML | Freq: Once | INTRAMUSCULAR | Status: DC | PRN
Start: 2015-08-09 — End: 2015-08-09

## 2015-08-09 MED ORDER — DEXAMETHASONE SODIUM PHOSPHATE 4 MG/ML IJ SOLN
4 | INTRAMUSCULAR | Status: AC
Start: 2015-08-09 — End: 2015-08-09

## 2015-08-09 MED ORDER — ONDANSETRON HCL 4 MG/2ML IJ SOLN
4 | Freq: Four times a day (QID) | INTRAMUSCULAR | Status: DC | PRN
Start: 2015-08-09 — End: 2015-08-10
  Administered 2015-08-09: 22:00:00 4 mg via INTRAVENOUS

## 2015-08-09 MED ORDER — LACTATED RINGERS IV SOLN
INTRAVENOUS | Status: DC
Start: 2015-08-09 — End: 2015-08-09
  Administered 2015-08-09: 11:00:00 125 mL/h via INTRAVENOUS

## 2015-08-09 MED ORDER — MIDAZOLAM HCL 2 MG/2ML IJ SOLN
2 | INTRAMUSCULAR | Status: AC
Start: 2015-08-09 — End: 2015-08-09

## 2015-08-09 MED ORDER — HYDROMORPHONE HCL 2 MG/ML IJ SOLN
2 | INTRAMUSCULAR | Status: AC
Start: 2015-08-09 — End: 2015-08-09

## 2015-08-09 MED ORDER — NORMAL SALINE FLUSH 0.9 % IV SOLN
0.9 | INTRAVENOUS | Status: DC | PRN
Start: 2015-08-09 — End: 2015-08-10
  Administered 2015-08-09: 22:00:00 10 mL via INTRAVENOUS

## 2015-08-09 MED ORDER — FENTANYL CITRATE (PF) 100 MCG/2ML IJ SOLN
100 MCG/2ML | INTRAMUSCULAR | Status: DC | PRN
Start: 2015-08-09 — End: 2015-08-09

## 2015-08-09 MED ORDER — DIAZEPAM 5 MG PO TABS
5 MG | Freq: Four times a day (QID) | ORAL | Status: DC | PRN
Start: 2015-08-09 — End: 2015-08-10
  Administered 2015-08-09 – 2015-08-10 (×5): 5 mg via ORAL

## 2015-08-09 MED ORDER — HYDRALAZINE HCL 20 MG/ML IJ SOLN
20 MG/ML | INTRAMUSCULAR | Status: DC | PRN
Start: 2015-08-09 — End: 2015-08-09

## 2015-08-09 MED ORDER — NORMAL SALINE FLUSH 0.9 % IV SOLN
0.9 | Freq: Two times a day (BID) | INTRAVENOUS | Status: DC
Start: 2015-08-09 — End: 2015-08-10

## 2015-08-09 MED ORDER — LABETALOL HCL 5 MG/ML IV SOLN
5 MG/ML | INTRAVENOUS | Status: DC | PRN
Start: 2015-08-09 — End: 2015-08-09

## 2015-08-09 MED ORDER — BACITRACIN 50000 UNITS IM SOLR
50000 | INTRAMUSCULAR | Status: AC
Start: 2015-08-09 — End: 2015-08-09

## 2015-08-09 MED ORDER — EPHEDRINE SULFATE 50 MG/ML IJ SOLN
50 | INTRAMUSCULAR | Status: AC
Start: 2015-08-09 — End: 2015-08-09

## 2015-08-09 MED ORDER — BUPIVACAINE 0.25% ELASTOMERIC INFUSION
Status: DC
Start: 2015-08-09 — End: 2015-08-10
  Administered 2015-08-09: 19:00:00 270 mL

## 2015-08-09 MED ORDER — ISOSULFAN BLUE 1 % SC SOLN
1 | SUBCUTANEOUS | Status: AC
Start: 2015-08-09 — End: 2015-08-09

## 2015-08-09 MED ORDER — FAMOTIDINE 20 MG/2ML IV SOLN
20 | INTRAVENOUS | Status: AC
Start: 2015-08-09 — End: 2015-08-09

## 2015-08-09 MED ORDER — TECHNETIUM TC 99M FILTERED SULFUR COLLOID
Freq: Once | Status: AC | PRN
Start: 2015-08-09 — End: 2015-08-09
  Administered 2015-08-09: 12:00:00 800 via INTRAVENOUS

## 2015-08-09 MED ORDER — CEFAZOLIN 2000 MG D5W 50 ML IVPB
Freq: Once | Status: AC
Start: 2015-08-09 — End: 2015-08-09
  Administered 2015-08-09: 14:00:00 2 g via INTRAVENOUS

## 2015-08-09 MED ORDER — ROCURONIUM BROMIDE 100 MG/10ML IV SOLN
100 | INTRAVENOUS | Status: AC
Start: 2015-08-09 — End: 2015-08-09

## 2015-08-09 MED ORDER — METOCLOPRAMIDE HCL 5 MG/ML IJ SOLN
5 | INTRAMUSCULAR | Status: AC
Start: 2015-08-09 — End: 2015-08-09

## 2015-08-09 MED ORDER — BUPIVACAINE HCL (PF) 0.25 % IJ SOLN
0.25 | INTRAMUSCULAR | Status: AC
Start: 2015-08-09 — End: 2015-08-09

## 2015-08-09 MED FILL — TECHNETIUM TC 99M FILTERED SULFUR COLLOID: Qty: 1

## 2015-08-09 MED FILL — CEFAZOLIN SODIUM 1 G IJ SOLR: 1 g | INTRAMUSCULAR | Qty: 1000

## 2015-08-09 MED FILL — METOCLOPRAMIDE HCL 5 MG/ML IJ SOLN: 5 MG/ML | INTRAMUSCULAR | Qty: 2

## 2015-08-09 MED FILL — PERCOCET 5-325 MG PO TABS: 5-325 MG | ORAL | Qty: 2

## 2015-08-09 MED FILL — PROPOFOL 200 MG/20ML IV EMUL: 200 MG/20ML | INTRAVENOUS | Qty: 20

## 2015-08-09 MED FILL — ANECTINE 20 MG/ML IJ SOLN: 20 MG/ML | INTRAMUSCULAR | Qty: 10

## 2015-08-09 MED FILL — DIAZEPAM 5 MG PO TABS: 5 MG | ORAL | Qty: 1

## 2015-08-09 MED FILL — BUPIVACAINE 0.25% ELASTOMERIC INFUSION: Qty: 270

## 2015-08-09 MED FILL — HYDROMORPHONE HCL 2 MG/ML IJ SOLN: 2 MG/ML | INTRAMUSCULAR | Qty: 1

## 2015-08-09 MED FILL — BACITRACIN 50000 UNITS IM SOLR: 50000 units | INTRAMUSCULAR | Qty: 1

## 2015-08-09 MED FILL — ONDANSETRON HCL 4 MG/2ML IJ SOLN: 4 MG/2ML | INTRAMUSCULAR | Qty: 2

## 2015-08-09 MED FILL — LIDOCAINE-PRILOCAINE 2.5-2.5 % EX CREA: CUTANEOUS | Qty: 5

## 2015-08-09 MED FILL — FENTANYL CITRATE (PF) 250 MCG/5ML IJ SOLN: 250 MCG/5ML | INTRAMUSCULAR | Qty: 5

## 2015-08-09 MED FILL — BRIDION 200 MG/2ML IV SOLN: 200 MG/2ML | INTRAVENOUS | Qty: 2

## 2015-08-09 MED FILL — DEXAMETHASONE SODIUM PHOSPHATE 4 MG/ML IJ SOLN: 4 MG/ML | INTRAMUSCULAR | Qty: 1

## 2015-08-09 MED FILL — GENTAMICIN SULFATE 40 MG/ML IJ SOLN: 40 MG/ML | INTRAMUSCULAR | Qty: 2

## 2015-08-09 MED FILL — SODIUM CHLORIDE 0.9 % IJ SOLN: 0.9 % | INTRAMUSCULAR | Qty: 10

## 2015-08-09 MED FILL — HYDROMORPHONE HCL 1 MG/ML IJ SOLN: 1 MG/ML | INTRAMUSCULAR | Qty: 1

## 2015-08-09 MED FILL — CEFAZOLIN SODIUM 1 G IJ SOLR: 1 g | INTRAMUSCULAR | Qty: 2000

## 2015-08-09 MED FILL — FAMOTIDINE 20 MG/2ML IV SOLN: 20 MG/2ML | INTRAVENOUS | Qty: 2

## 2015-08-09 MED FILL — CEFAZOLIN 2000 MG D5W 50 ML IVPB: Qty: 50

## 2015-08-09 MED FILL — SENSORCAINE-MPF 0.25 % IJ SOLN: 0.25 % | INTRAMUSCULAR | Qty: 30

## 2015-08-09 MED FILL — LUBRIFRESH P.M. OP OINT: OPHTHALMIC | Qty: 3.5

## 2015-08-09 MED FILL — MIDAZOLAM HCL 2 MG/2ML IJ SOLN: 2 MG/ML | INTRAMUSCULAR | Qty: 2

## 2015-08-09 MED FILL — SODIUM CHLORIDE 0.9 % IJ SOLN: 0.9 % | INTRAMUSCULAR | Qty: 20

## 2015-08-09 MED FILL — ISOSULFAN BLUE 1 % SC SOLN: 1 % | SUBCUTANEOUS | Qty: 5

## 2015-08-09 MED FILL — EPHEDRINE SULFATE 50 MG/ML IJ SOLN: 50 MG/ML | INTRAMUSCULAR | Qty: 1

## 2015-08-09 MED FILL — ROCURONIUM BROMIDE 100 MG/10ML IV SOLN: 100 MG/10ML | INTRAVENOUS | Qty: 10

## 2015-08-09 NOTE — Progress Notes (Signed)
The following education and goals will be achieved upon completion of the patient's care in SDS:    IdIdentify the learner who is being assessed for education: patient  Ability to Learn:  Exhibits ability to grasp concepts and respond to questions: High  Ready to Learn: Yes  calm  Preferred Method of Learning:  verbal  Barriers to Learning: Verbalizes interest  Special Considerations due to cultural, religious, spiritual beliefs:  no  Language: English  Language Interpreter: no    [place]  [x] Appropriate evaluation / integration of data as delineated by ASPAN Standards of [place].    Pain scale and pain management  [x] Patient will verbalize understanding of pain scale and pain management.  [x] Pre-operative determination of patient???s anticipated Post-Operative pain goal: 4 of 10 on 10 point scale post op goal  [x] Patient will verbalize plan for pain management  [] Other     Compliance with Pre-op Instructions - Patient reports compliance with:  [x] Taking prescribed home meds before arrival  [x] Surgical prep instructions specific to the patient's surgery    Fall Risk - PreOperatively   [] No preoperative risk identified  [x] Preop risk identified:      []  Sensory deficit     []  Motor deficit     []  Balance problem     []  Home medication     [] Uses assistive device to ambulate      [] History of a Fall within the last 30 days  [x] Due to Perioperative medication administration    Goal(s) for fall prevention:  [x]  Prevent fall or injury by use of encouraging call light for assistance, side rails, and assisting with activity.  [x] Patient / Significant other verbalize understanding of need to call for assistance prior to getting out of bed.    Infection Precautions                                                                                                   [x]  Patient understands implementation of [facility] Infection precautions  [x]  Handwashing, skin prep  prior to IV insertion, hair clipped at surgical site if needed  [x]  Pre op antibiotic, if ordered    Patient Safety  [x]  Patient identification  [x]  Site verification (See Universal Protocol Checklist)  [x]  General Safety precautions  [x]  Side rails up, bed/stretcher in low position, wheels locked  [x]  Call light within reach  [x]  Patient instructed to call for assistance prior to getting out of bed    Instructions - Discharge planning for Outpatients  [x]  Patient / significant other voices understanding of home care and follow up procedures.  Anticipated Special Needs upon discharge:  []  Cooling device  []  Crutches  []  Walker  []  Wound Support device   [] Drain  [] Other   []  See [facility] Procedure Discharge Instructions    Instructions - Discharge planning for Admitted Patients  [x]  Patient / Significant other understands plan for admission after surgery  []  Patient / Significant other understands plan for anticipated discharge disposition  08/09/2015 6:06 AM

## 2015-08-09 NOTE — Progress Notes (Signed)
General Surgery           Post-Op Note    Subjective:  Pt reports 6/10 pain with left sided muscle contractions most bothersome. Patient denies nausea or vomiting. Has not ambulated or voided yet.  Denies any other chest pain, SOB.     Objective:  Vitals:    08/09/15 1545 08/09/15 1600 08/09/15 1730 08/09/15 1739   BP: (!) 100/58 99/65  96/63   Pulse: 84 85  79   Resp: 16 15 16 16    Temp:  97.7 ??F (36.5 ??C)  97.4 ??F (36.3 ??C)   TempSrc:  Temporal  Oral   SpO2: 100% 98% 97% 98%   Weight:       Height:             Intake/Output Summary (Last 24 hours) at 08/09/15 1753  Last data filed at 08/09/15 1600   Gross per 24 hour   Intake             3320 ml   Output              625 ml   Net             2695 ml       IntraOp Crystalloid 3000    EBL 125    U/O 400   PostOp U/O  0    Drains 100                Physical Exam:      Vitals:    08/09/15 1739   BP: 96/63   Pulse: 79   Resp: 16   Temp: 97.4 ??F (36.3 ??C)   SpO2: 98%     GEN:  awake, alert, cooperative, no apparent distress  LUNGS:  CTA B/L, 2L supplemental O2 NC; IS at bedside  CARDIOVASCULAR: RRR, non-tachy  CHEST: 4 JP drains with sanguinous consistency, bilateral incision sites are covered with dressing that are clean,dry and intact, no hematoma or bruising visible or palpated, axilla soft bilaterally  ABDOMEN:  Soft, non tender, non-distended, +BS,     Assessment and Plan:  55 y.o. female POD # 0 s/p Bilateral simple Mastectomy, Left sentinel node biopsy, reconstruction with tissue expanders.       Pain:  Dilaudid, Valium, Percocet PRN    Diet:  Clear Liquid Diet    Fluids:  LR @ 125    Abx:  Ancef Q8H    Activity: Up and ambulating with assisstance as tolerated,     PPx:  SCDs      Clyda Hurdle, [place]   08/09/2015, 5:53 PM     I have reviewed and discussed with student about the patient and agree with his/her physical exam findings and assessment/plan, made adjustments accordingly above    Ferdie Ping, MD  6:09 PM  08/09/2015  [phone]       As  above  Patient doing well  Discussed with Dr. Alvan Dame  OK for discharge if ok with plastics

## 2015-08-09 NOTE — Progress Notes (Signed)
Ancef 2 gm sent to OR

## 2015-08-09 NOTE — Brief Op Note (Signed)
Brief Postoperative Note    Karen Bright  Date of Birth:  [birth year]  [id number]    Pre-operative Diagnosis: left breast cancer    Post-operative Diagnosis: Same    Procedure: B immediate breasrt recon w/tissue expander and Alloderm, insertion on q pain catheter    Anesthesia: General    Surgeons/Assistants: Shailen Thielen    Estimated Blood Loss: less than 50     Complications: None    Specimens: Was Not Obtained    Findings: acquired absence B breasts    Electronically signed by Guadelupe Sabin, MD on 08/09/2015 at 2:20 PM

## 2015-08-09 NOTE — Progress Notes (Signed)
Patient arrived from OR to PACU spot 17. Attached to monitoring system. Report received per CRNA [employer]. She reported removing foley catheter prior to coming to PACU with urine output of 400Ml during OR. Patient arrived slighlty restless however denying pain at this time. OnQ pain pump attached correctly and infusing. Patient repositioned for comfort with 2 pillows placed beneath arms for support. All dressings clean, dry and intact with support bra in place and all JP drains in place. No problems reported intraoperatively. VSS.

## 2015-08-09 NOTE — Discharge Summary (Signed)
Discharge Summary      Patient:  Karen Bright    Admit Date: 08/09/2015  5:00 AM    Discharge Date: 08/10/15    Admitting Physician: Delos Haring, DO     Discharge Physician: same    Admitting Diagnosis:  Left sided breast cancer    Discharge Diagnosis: same    Past Medical History:   Diagnosis Date   ??? Cancer ([place])    ??? COPD (chronic obstructive pulmonary disease) ([place])    ??? COPD, mild (H[facility])-dx PFT's [place] 2017 08/03/2015        Indication for Admission:   55 year old female with a diagnosis of invasive left breast cancer.  Surgical options were discussed with the patient, and she wished to undergo bilateral mastectomy.  Sentinel lymph node biopsy was recommended secondary to her cancer being invasive and she was agreeable to this.  She wished to undergo reconstruction and this is to be completed today in the first stage by Dr. Candice Camp.    Hospital Course:   Pt underwent bilateral simple mastectomies, Left sentinel lymph node biopsy and immediate bilateral reconstruction with tissue expanders. Procedure was tolerated well. Stable post op course. On POD#1 she was stable for discharge.    Procedures:  08/09/15  1. bilateral simple mastectomy.  2. Left sentinel lymph node biopsy.  3. Injection blue dye for use in sentinel lymph node biopsy.    Consulting services:  none    Discharge physical exam:  General appearance: alert, lying comfortably in bed  Neuro: Alert & Orientated x3  Chest: 4 JP drains with sanguinous consistency, bilateral incision sites are covered with dressing that are clean,dry and intact, no hematoma or bruising visible or palpated, axilla soft bilaterally  Lungs: CTAB  Cardiovascular: RRR, S1, S2 normal, no m/g/r  Abdomen: soft, NT, ND, +BS  Ext: no edema    Disposition:  home    Condition at discharge:  Stable    Discharge Instructions:  See separate form    Patient Instructions:      Medication List      ASK your doctor about these medications          TRIAMCINOLONE ACETONIDE EX              Allene Pyo, MD  08/09/15  3:06 PM

## 2015-08-09 NOTE — Anesthesia Post-Procedure Evaluation (Signed)
Anesthesia Post-op Note    Name: Karen Bright   DOB: October 01, 1960    MRN: [id number]   Procedure(s) Performed:   1. bilateral??simple mastectomy.  2. Left sentinel lymph node biopsy  3) Reconstruction  Date of Surgery: 08/09/15  Anesthesia type: GENERAL      POST-OP ASSESSMENT::    ?? Anesthetic problems: No  Vitals (most recent):  height is 5\' 3"  (1.6 m) and weight is 217 lb (98.4 kg). Her oral temperature is 97.4 ??F (36.3 ??C). Her blood pressure is 96/63 and her pulse is 79. Her respiration is 16 and oxygen saturation is 98%.   ??    ?? Cardiovascular system stable: Yes  ?? Respiratory function:  airway patent: Yes  ventilator and/or ETT: No  ?? Hydration adequate: Yes    ?? Nausea: No  ?? Level of consciousness: awake, alert and oriented  ?? Post-op pain control: adequate  ?? Other:    assessment completed and signed @:   Fonda Kinder, M.D. August 09, 2015  6:51 PM

## 2015-08-09 NOTE — Progress Notes (Signed)
[facility] PACU Education and Care Plan Goals  The following items will be achieved upon completion of the patient's transfer or discharge from the PACU:    Post Operative Pain Management                                                                               _0  Patient will verbalize understanding of pain scale and pain management.  _1  Patient achieves predetermined pain goal of 4  _2  Self reports a comfort level acceptable for discharge  _3  Other     Fall Risk Potential  _4  Due to Perioperative medication administration  Additional Risk Identified:   _5  Sensory deficit         _6  Motor deficit         _7  Balance problem         _8  Home medication         _9  Uses assistive device to ambulate    Goal(s) for fall prevention:  _10  Prevent fall or injury by calling for assistance with activity and use of siderails while hospitalized  _11  Prevent fall or injury by using assistance with activity after discharge.  _12  Patient / Significant other verbalize understanding in use of any ordered assistive devices    Mobility Safety/ ADL  _13  Reach a functional mobility goal within limitations of the procedure.    Infection Precautions                                                                                                            _14 Patient understands implementation of infection precautions (see [facility] Presurgical Instructions and SSI Prevention Handout)    Post operative Assessment and Care                                                             _15  Standards of care met as delineated by ASPAN.                                                              Discharge Education and Goals  _16 Patient voices understanding of PACU discharge criteria  _17 Outpatient / significant other voices understanding of home care and follow up procedures (See [facility] Procedure Discharge Instructions)  _18  Patient / significant other understanding of Special Needs:  _19  Cooling device  _20 Wound Support  Device  _21   Crutches   []Drain    [] Walker   []Other  [x] Inpatient / significant other understands the plan for transfer to the inpatient unit

## 2015-08-09 NOTE — Op Note (Signed)
Op Note    Date of Service: 08/09/2015    Preoperative Diagnosis:  left breast cancer.    Postoperative Diagnosis:  Same    Procedure:    1. bilateral simple mastectomy.   2. Left sentinel lymph node biopsy.   3. Injection blue dye for use in sentinel lymph node biopsy.    Surgeon:  Leda Roys. Shapiro-Wright, DO    Assistant(s):  Deatra Ina, MD    Anesthesia:  General.    EBL:  100 cc    Fluids:  1900 cc Crystalloid    Urine Output: 15 cc    Specimen:   1. Right breast suture lateral   2. Right breast superior lateral flap, long suture lateral, short suture superior   3. Right breast superior central flap, long suture lateral, short suture superior   4. Left breast, suture lateral   5. Sentinel lymph node x 3    Drains: Per plastic surgery    Indication for Procedure:    Patient is a 55 year old female with a diagnosis of invasive left breast cancer.  Surgical options were discussed with the patient, and she wished to undergo bilateral mastectomy.  Sentinel lymph node biopsy was recommended secondary to her cancer being invasive and she was agreeable to this.  She wished to undergo reconstruction and this is to be completed today in the first stage by Dr. Candice Camp.  All surgical procedures along with risks/benefits were discussed and all questions were answered.  Informed consent was obtained.      Sentinel Lymph Node:    Hot Spot: Left axilla  Blue dye injection time: 0935  Pre-incision 10 second count: 493  Background 10 second count: 68  Sentinel lymph node #1, Level 1 (deep), No blue dye present, 10 second ex-vivo count: 1064  Sentinel lymph node #2, Level 1 (deep), No blue dye present, 10 second ex-vivo count: 928  Sentinel lymph node #3, Level 1 (deep), No blue dye present, 10 second ex-vivo count: 603  Final Bed 10 second count: 18      Description of Procedure:  Prior to the procedure the patient was taken to the nuclear medicine department at which time she was injected with radiotracer technecium 99 for  use in sentinel lymph node biopsy.    The patient was taken to the operating room. She was placed supine on the table.  Cardiac and respiratory monitors were placed by the department of anesthesia and the patient was then administered general anesthesia.  A time out was completed and the patient and site were verified. Everyone in the operating room was in agreement.     The gamma probe was then used to localize a hot spot in the patient's left axilla. Pre-operative ten second counts were then obtained at the hotspot and 1/2 way between the hot spot and injection site (background count). These were documented as above.  In addition, the probe was run over the clavicular (superior and inferior) and parasternal regions and no other uptake was noted other than in the left axilla.  The breast was then cleaned with an alcohol swab and 2 cc of lymphazerium blue dye were injected into the left breast just lateral to the areola/skin border using a 25 gauge needle. The breast was then massaged for three minutes.    Her bilateral breast and axilla was prepped and draped in the normal sterile fashion.     Attention was first paid to the right breast and prophylactic mastectomy was  completed.    An incision was drawn on the breast outlining an ellipse of the skin to include the nipple and areolar complex. An incision was made using a 15 blade scalpel. Dissection was then carried down using Bovie electrocautery to create thin superior and inferior skin flaps. The dissection was taken to superiorly to the level of the clavical/clavipectoral fascia, medially to the sternum, inferiorly to the inframammary line and laterally towards the axilla and to the level of the latissimus dorsi muscle.  The dissection also included the axillary tail of the breast.  Posterior dissection removed the pectoralis fascia with the surgical specimen.A fter all of breast tissue was removed it was tagged with a silk suture in the lateral aspect and sent  to pathology.  With palpation of the flaps it was felt that the superior lateral and central aspect of the flap was thicker than desired.  Therefore with the use of cautery the flap was thinned and the tissue was tagged as above and sent to pathology.  Cautery was used to achieve hemostasis. The site was then irrigated with saline and suctioned.  Again it was inspected and good hemostasis was noted.  A very lightly moist lap was then placed in the surgical cavity and a green towel was placed over the incision.    Gowns/gloves were changed and a new instrument set was used to complete the left sided surgery.    The mastectomy was completed in the same fashion on the left as just described on the right.  However on the left sentinel lymph node biopsy was performed.  After the superior skin flap was created the sentinel lymph node biopsy was completed. Visualization of the blue dye, use of radiolabelled material with an audible gamma counter, along with palpation were used to locate all sentinel lymph nodes. A total of 3 sentinel lymph nodes were located and removed. The sentinel lymph nodes removed were evaluated as to the presence of blue dye, level of origin, and 10 second cant with the gamma probe. These were all documented as above. Once all sentinel lymph nodes were removed they were sent to pathology for intraoperative evaluation. Intraoperative pathology reported that sentinel lymph node #1 and #2 appeared on frozen section to have artifact, however this could not be verified as metastatic disease and pathology deferred to permanent section/final pathology for diagnosis.  Therefore, as no true metastasis could be proven it was decided that no additional lymph nodes would be removed.  . A 10 second final bed count was taken and was documented as above.    At the end of this portion of the procedure the patient was left on the operating table in stable condition.  Dr. Candice Camp was present to begin the reconstruction.   At the end of this portion of the procedure all sponge, instrument and needle counts were correct at the end of the procedure.     I was present throughout the entire procedure.      Patient may require further surgery/return to OR pending final surgical pathology evaluation of margins and lymph nodes.          [employer], DO  08/09/2015  11:46 AM

## 2015-08-09 NOTE — Progress Notes (Signed)
4 eye skin assessment done with Hilma Favors RN, no skin breakdown noted on back, ears , buttocks, legs or arm. Old blister on back of left heel with  bandaid in place.

## 2015-08-09 NOTE — H&P (Signed)
Karen Bright    [id number]    [facility] Same Day Surgery Update H & P  Department of General Surgery   Surgical Service   Physician Assistant Pre-operative History and Physical  Last H & P within the last 30 days.    DIAGNOSIS:   BREAST CANCER OF UPPER QUADRANT O    PROCEDURE:  Bilateral Breast Mastectomy, Left Breast Sentinel Lymph Node Biopsy    Possible Axillary Lymph Node Dissection    Stage 1 Reconstruction Bilateral Tissue Expanders With Alloderm And On Q Pain Catheter      HISTORY OF PRESENT ILLNESS:   Patient has breast cancer found on routine mammogram     Past Medical History:        Diagnosis Date   ??? Cancer ([place])    ??? COPD (chronic obstructive pulmonary disease) ([place])    ??? COPD, mild (HCC)-dx PFT's [place] 2017 08/03/2015         Obesity    Past Surgical History:        Procedure Laterality Date   ??? APPENDECTOMY  1971   ??? BREAST BIOPSY  2017   ??? TOE AMPUTATION Right 2013    right great toe with node removal     Past Social History:  Social History     Social History   ??? Marital status: Married     Spouse name: N/A   ??? Number of children: N/A   ??? Years of education: N/A     Social History Main Topics   ??? Smoking status: Former Smoker     Types: Cigarettes     Quit date: 1985   ??? Smokeless tobacco: Never Used   ??? Alcohol use Yes   ??? Drug use: No   ??? Sexual activity: Not Asked     Other Topics Concern   ??? None     Social History Narrative    Exercises regularly        Lives in [place]         Medications Prior to Admission:      Prior to Admission medications    Medication Sig Start Date End Date Taking? Authorizing Provider   TRIAMCINOLONE ACETONIDE EX Apply topically    Historical Provider, MD         Allergies:  Review of patient's allergies indicates no known allergies.    PHYSICAL EXAM:      BP 108/75   Pulse 57   Temp 98.1 ??F (36.7 ??C) (Oral)    Resp 16   Ht 5\' 3"  (1.6 m)   Wt 217 lb (98.4 kg)   LMP 08/08/2012   BMI 38.44 kg/m2     Heart:  regular rate and rhythm, no murmur    Lungs:  No increased  work of breathing, good air exchange, clear to auscultation bilaterally, no crackles or wheezing    Abdomen:  Obese, soft, non-distended, non-tender, no rebound tenderness or guarding, normal active bowel sounds and no masses palpated    ASSESSMENT AND PLAN:    1.  Patient seen and focused exam done today- no new changes since last physical exam on 08-03-2015    2.  Access to ancillary services are available per request of the provider.    Darnelle Bos, [place]     08/09/2015

## 2015-08-09 NOTE — Plan of Care (Signed)
Problem: Pain:  Goal: Pain level will decrease  Pain level will decrease   Outcome: Ongoing  Patient stated pain was not increasing, pt stated she is still having muscles spasms, nurse will continue to assess patient pain level and treat accordingly.

## 2015-08-09 NOTE — Progress Notes (Signed)
PACU Transfer Note    Vitals:    08/09/15 1600   BP: 99/65   Pulse: 85   Resp: 15   Temp: 97.7 ??F (36.5 ??C)   SpO2: 98%       In: 320 [P.O.:120; I.V.:200]  Out: 100 [Drains:100]    Pain assessment:   Bilateral breast pain across chest. Feels like I cant take a good breath. Reassured patient she was breathing well and had good breath sounds bilaterally.  Pain Level: 5    Report given to Receiving unit RN.Oley Balm RN    08/09/2015 4:08 PM

## 2015-08-10 MED ORDER — DIAZEPAM 5 MG PO TABS
5 MG | ORAL_TABLET | Freq: Four times a day (QID) | ORAL | 1 refills | Status: AC | PRN
Start: 2015-08-10 — End: 2015-08-20

## 2015-08-10 MED ORDER — CEPHALEXIN 500 MG PO CAPS
500 MG | ORAL_CAPSULE | Freq: Four times a day (QID) | ORAL | 0 refills | Status: AC
Start: 2015-08-10 — End: 2015-08-23

## 2015-08-10 MED ORDER — DOCUSATE SODIUM 100 MG PO CAPS
100 MG | ORAL_CAPSULE | Freq: Two times a day (BID) | ORAL | 1 refills | Status: AC | PRN
Start: 2015-08-10 — End: 2015-09-08

## 2015-08-10 MED ORDER — OXYCODONE-ACETAMINOPHEN 5-325 MG PO TABS
5-325 MG | ORAL_TABLET | Freq: Four times a day (QID) | ORAL | 0 refills | Status: AC | PRN
Start: 2015-08-10 — End: 2015-08-19

## 2015-08-10 MED FILL — DIAZEPAM 5 MG PO TABS: 5 MG | ORAL | Qty: 1

## 2015-08-10 MED FILL — CEFAZOLIN SODIUM 1 G IJ SOLR: 1 g | INTRAMUSCULAR | Qty: 1000

## 2015-08-10 MED FILL — LACTATED RINGERS IV SOLN: INTRAVENOUS | Qty: 1000

## 2015-08-10 MED FILL — PERCOCET 5-325 MG PO TABS: 5-325 MG | ORAL | Qty: 2

## 2015-08-10 MED FILL — PERCOCET 5-325 MG PO TABS: 5-325 MG | ORAL | Qty: 1

## 2015-08-10 NOTE — Progress Notes (Addendum)
Patient is alert and oriented x4 with stable vital signs. Patient current primary complaint is of surgical pain at the incision sites. Pain is being controlled with PO pain medication. Dry dressings are clean dry and in tact with surgical bra in place. All 4 JP drains are putting out bloody drainage. Pain ball catheter still in tact. Patient is urinating adequately. IV infusing. Urine is green but progressively clearing up. Patient tolerating ambulation well. Stand by assist. Will continue to monitor.

## 2015-08-10 NOTE — Plan of Care (Signed)
Problem: Pain:  Goal: Pain level will decrease  Pain level will decrease   Outcome: Ongoing  Pt c/o aching/cramping pain in chest. Medicated and satisfied with prn percocet and valium. Instructed to call RN for an increase in pain. Will continue to monitor.     Problem: Falls - Risk of  Goal: Absence of falls  Outcome: Ongoing  Pt has been free from fall during shift. Pt has called out appropriately for needs. Call light in reach. Bed in lowest position. Bed alarm on. Will continue to monitor

## 2015-08-10 NOTE — Op Note (Signed)
[facility]    PATIENT NAME: Karen Bright, Karen Bright                        DOB:            28-Sep-2060  MED REC NO:   [id number]                            ROOM:  ACCOUNT NO:   [id number]                            ADMISSION DATE: 08/09/15  PHYSICIAN:    Mervyn Skeeters, MD    PREOPERATIVE DIAGNOSIS:  Left breast cancer.    POSTOPERATIVE DIAGNOSIS:  Left breast cancer.    PROCEDURE:  Bilateral immediate breast reconstruction with tissue expanders   AlloDerm,  insertion of ON-Q pain catheter.    SURGEON:  Mervyn Skeeters, MD    ANESTHESIA:  General.    ESTIMATED BLOOD LOSS:  Minimal.    INDICATION:  The patient is a 55 year old with left breast cancer, who is   undergoing  bilateral mastectomies by Dr. Kary Kos and she desires immediate  reconstruction.  We have discussed the options and stages of a tissue expander  reconstruction, alternatives, risks, and recovery and she wishes to proceed   with  tissue expanders.  Informed consent was obtained.    DESCRIPTION:  After the mastectomies were performed, each was weighed.  The   left  breast weighed 740 g and the right breast weighed 830 g.  The operative field   was  redraped, first on the right side as the subpectoral pocket was created up to   the 3  o' clock position on the sternum.  Then, the inferior pole was reconstructed   with a  piece of medium contour thick AlloDerm.  The AlloDerm was stitched with the   pec major  medially and along the inframammary fold with interrupted 3-0 Vicryl , dermal   side  up.  Then, the pec was irrigated copiously, checked for hemostasis.  A   subpectoral  drain was placed and an ON-Q pain catheter was inserted using Seldinger   technique  through the inferior medial aspect of the wound.  It fixed externally with  Steri-Strips and Tegaderm.  Then, gloves were changed and a Mentor 650 cc   medium  height contour profile expander was prepared.  The air was removed and a total   of 480  cc of sterile  injectable saline was placed into it.  The tags were sutured   down to  the chest wall with 2-0 PDS.  The AlloDerm was stitched with the pec major to   give  complete expander coverage.  After irrigating again, a subcutaneous drain was   placed  and then the skin was closed to make a transverse mastectomy scar with buried  interrupted 3-0 Vicryl and running 4-0 Monocryl subcuticular stitch.  Then,   procedure  was performed on the left breast.  First with subpectoral pocket, then the   AlloDerm,  then the drain and ON-Q pain catheter.  Identical type expanders placed and   the  AlloDerm was sutured to the muscle.  After irrigating again and checking for  hemostasis, subcutaneous drain was placed.  The skin was closed in layers.    Wounds  were dressed with bacitracin, Adaptic, sterile  gauze, and surgical bra.  At   the end  of the case, all counts were correct.  There were no apparent complications.      Mervyn Skeeters, MD    D:08/16/2015 17:18:45               T: 08/17/2015 9:50:27     [email or web address]  Job#: [id number]     Doc#: [id number]

## 2015-08-10 NOTE — Other (Signed)
08/10/15 1451   Encounter Summary   Services provided to: Patient   Referral/Consult From: Rounding   Support System Friends/neighbors   Continue Visiting (IS 08/10/15)   Complexity of Encounter Low   Length of Encounter 15 minutes   Routine   Type Post-procedure   Assessment Approachable;Hopeful   Intervention Nurtured hope;Blessing   Outcome Acceptance;Comfort;Expressed gratitude;Expressed feelings of joy, peace, and/or awe

## 2015-08-10 NOTE — Other (Addendum)
Patient Acct Nbr:  [id number]  Primary AUTH/CERT:    [place] Name:   [place]  Primary Insurance Plan Name:  [place] POS/SELECT PLUS/CHOICE  Primary Insurance Group Number:  [id number]  Primary Insurance Plan Type: N  Primary Insurance Policy Number:  [id number]

## 2015-08-10 NOTE — Progress Notes (Signed)
Pt has no c/o  afvss  Chest soft, no hematoma  JP serosang  Skin flaps viable    POD 1  Doing well  Mobilize  Plan d/c home today  Instructions given  Fu Cammie Faulstich 1 week

## 2015-08-10 NOTE — Progress Notes (Signed)
Surgery Daily Progress Note    CC: breast reconstruction    Subjective :  No acute events overnight. Tolerating diet, ambulating, pain controlled    Objective    Infusions:  ??? bupivacaine 0.25% 270 mL (08/09/15 1504)   ??? lactated ringers 125 mL/hr at 08/09/15 2244        I/O:I/O last 3 completed shifts:  In: [id number] [P.O.:120; I.V.:3200]  Out: 970 [Urine:600; Drains:245; Blood:125]           Wt Readings from Last 1 Encounters:   08/09/15 217 lb (98.4 kg)                 LABS:  No results for input(s): WBC, HGB, HCT, MCV, PLT in the last 72 hours.   No results for input(s): NA, K, CL, CO2, PHOS, BUN, CREATININE in the last 72 hours.    Invalid input(s): CA   No results for input(s): AST, ALT, ALB, BILIDIR, BILITOT, ALKPHOS in the last 72 hours.   No results for input(s): LIPASE, AMYLASE in the last 72 hours.   No results for input(s): PROT, INR, APTT in the last 72 hours.   No results for input(s): CKTOTAL, CKMB, CKMBINDEX, TROPONINI in the last 72 hours.      Exam:  Vitals:    08/09/15 1739 08/09/15 2026 08/09/15 2231 08/10/15 0304   BP: 96/63 102/68 103/68 114/75   Pulse: 79 71 77 71   Resp: 16 16 16 16    Temp: 97.4 ??F (36.3 ??C) 98 ??F (36.7 ??C) 97.5 ??F (36.4 ??C) 99.4 ??F (37.4 ??C)   TempSrc: Oral Oral Oral Oral   SpO2: 98% 100% 95% 97%   Weight:       Height:           General appearance: alert, lying comfortably in bed  Neuro: Alert & Orientated x3  Chest: 4 JP drains with sanguinous consistency, bilateral incision sites are covered with dressing that are clean,dry and intact, no hematoma or bruising visible or palpated, axilla soft bilaterally  Lungs: CTAB  Cardiovascular: RRR, S1, S2 normal, no m/g/r  Abdomen: soft, NT, ND, +BS  Ext: no edema      ASSESSMENT/PLAN:   This is a 55 y.o. female s/p Bilateral simple Mastectomy, Left sentinel node biopsy, reconstruction with tissue expanders POD #1;    - cont. General diet  - cont. IV Fluids  - Social work for home health  - cont. Antibiotics and will go home with  antibiotics  - drain care teaching and on-Q pump teaching  - anticipate discharge today    Karen Sanika Brosious, DO  08/10/15  6:32 AM  IN:[id number]

## 2015-08-10 NOTE — Plan of Care (Signed)
Problem: Pain:  Goal: Control of acute pain  Control of acute pain   Outcome: Ongoing  Patient's surgical pain will remain at a manageable level throughout the duration of the shift. Patient understands prescribed pain medication regimen for its uses and side effects. Patient understands how to rate pain appropriately and when to call out if experiencing breakthrough pain. Patient understands how to utilize non pharmacological pain management techniques. Will continue to monitor.     Problem: Falls - Risk of  Goal: Absence of falls  Outcome: Ongoing  Patient will remain free of falls throughout the duration of the shift. Bed locked in lowest position. Non skid socks on. Bed and chair alarm activated. Call light and belongings within reach. Patient calls out approprietly. Stand by assist. Will continue to monitor.

## 2015-08-10 NOTE — Care Coordination-Inpatient (Signed)
Consult received to speak with patient regarding discharge plans. Pt is going to stay with a friend in [place] so [facility] will not be able to provide care for her. I called [facility] but both [place] and [place] offices do not accept the same insurances. Referral made with [facility] from [facility] The [place] office is able to see patient initially and then when the patient goes back to [place] their [place] office will be able to provide care. Melissa will be in to speak with patient. Placed information on discharge instructions.     Pt is going to [address] in [place], [place]. Pt's cell phone is on the facesheet.     Electronically signed by Rudene Christians, RN on 08/10/2015 at 12:23 PM

## 2015-08-10 NOTE — Progress Notes (Signed)
Pt is alert and oriented x4. VS are WDL. Surgical dressings CDI. Voiding an adequate amount. BS active, tolerating diet. Up to ambulate in room. Received orders to discharge pt. JP drain education provided. Discharge instructions reviewed. Prescriptions given. IV d/c without episode. Pt left in stable condition with friend and all of her belongings.

## 2015-08-22 ENCOUNTER — Ambulatory Visit: Admit: 2015-08-22 | Discharge: 2015-08-22 | Payer: PRIVATE HEALTH INSURANCE | Attending: Surgery

## 2015-08-22 DIAGNOSIS — C50912 Malignant neoplasm of unspecified site of left female breast: Secondary | ICD-10-CM

## 2015-08-22 NOTE — Patient Instructions (Signed)
Breast Self-Exam: Care Instructions  Your Care Instructions  A breast self-exam is when you check your breasts for lumps or changes. This regular exam helps you learn how your breasts normally look and feel. Most breast problems or changes are not because of cancer.  Breast self-exam is not a substitute for a mammogram. Having regular breast exams by your doctor and regular mammograms improve your chances of finding any problems with your breasts.  Some women set a time each month to do a step-by-step breast self-exam. Other women like a less formal system. They might look at their breasts as they brush their teeth, or feel their breasts once in a while in the shower.  If you notice a change in your breast, tell your doctor.  Follow-up care is a key part of your treatment and safety. Be sure to make and go to all appointments, and call your doctor if you are having problems. It???s also a good idea to know your test results and keep a list of the medicines you take.  How do you do a breast self-exam?  ?? The best time to examine your breasts is usually one week after your menstrual period begins. Your breasts should not be tender then. If you do not have periods, you might do your exam on a day of the month that is easy to remember.  ?? To examine your breasts:  ?? Remove all your clothes above the waist and lie down. When you are lying down, your breast tissue spreads evenly over your chest wall, which makes it easier to feel all your breast tissue.  ?? Use the pads???not the fingertips???of the 3 middle fingers of your left hand to check your right breast. Move your fingers slowly in small coin-sized circles that overlap.  ?? Use three levels of pressure to feel of all your breast tissue. Use light pressure to feel the tissue close to the skin surface. Use medium pressure to feel a little deeper. Use firm pressure to feel your tissue close to your breastbone and ribs. Use each pressure level to feel your breast tissue  before moving on to the next spot.  ?? Check your entire breast, moving up and down as if following a strip from the collarbone to the bra line, and from the armpit to the ribs. Repeat until you have covered the entire breast.  ?? Repeat this procedure for your left breast, using the pads of the 3 middle fingers of your right hand.  ?? To examine your breasts while in the shower:  ?? Place one arm over your head and lightly soap your breast on that side.  ?? Using the pads of your fingers, gently move your hand over your breast (in the strip pattern described above), feeling carefully for any lumps or changes.  ?? Repeat for the other breast.  ?? Have your doctor inspect anything you notice to see if you need further testing.  Where can you learn more?  Go to [email or web address] and sign in to your MyChart account. Enter P148 in the Search Health Information box to learn more about "Breast Self-Exam: Care Instructions."     If you do not have an account, please click on the "Sign Up Now" link.  Current as of: September 05, 2014  Content Version: 11.2  ?? 2006-2017 [employer]. Care instructions adapted under license by [facility]. If you have questions about a medical condition or this instruction, always ask your healthcare professional. Healthwise, Inc[employer] disclaims   any warranty or liability for your use of this information.

## 2015-08-22 NOTE — Progress Notes (Signed)
Patient Name: Karen Bright  Date of Birth: 1960/06/22  Primary Care Physician: Gilman Schmidt, NP   Medical Oncology: Caleen Essex, MD  Plastic Surgery: Mervyn Skeeters, MD      Subjective: Patient presents for post operative follow up.  She reports that she is doing well.  She was seen by plastic surgery who removed one of her drains.  She also reports that they completed her first fill.  She says she is scheduled to return to see Dr. Candice Camp next week.  She reports no pain.  She states her incisions are healing well.    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, NG 2  ER +, PR+, Her2neu -  Tumor 2.0 cm, 0/5 sentinel lymph nodes positive (all sentinel nodes negative)  MIB1 Proliferation Rate: 25%  Oncotype Dx Recurrence Score: 10      Vitals:    08/22/15 1506   BP: 108/61   Pulse: 70   Weight: 217 lb (98.4 kg)   Height: 5\' 3"  (1.6 m)         ROS  Constitutional: no weight loss, fever, night sweats   Skin: negative  Cardiovascular: no chest pain or palpitations   Pulmonary: No cough, sputum, or hemoptysis   GI:No abdominal pain  Breast: see above  All other systems were reviewed and are negative      Objective:  Breast:  The bilateral incisions are clean/dry/intact and healing well.  The bilateral mastectomy flaps are healthy without evidence of necrosis.  There is no evidence of erythema/cellulitis/infection.  The skin is without thickening (peau d'orange)/dimpling. There are no palpable masses/lesions.   There is no evidence of hematoma or seroma.    Axilla:  Bilateral axilla without palpable adenopathy.  No evidence of bilateral seroma/hematoma  Extremity: Left upper extremity without evidence of lymphedema.  Good range of motion.  Good grip and upper extremity muscle strength        Assessment:   1.  Status post bilateral mastectomy (left therapeutic/right prophylactic) with left sentinel lymph node biopsy 08/09/2015  2.  Status post bilateral first stage immediate reconstruction with tissue  expander placement (Dr. Candice Camp) 08/09/15  3.  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  Status post left breast ultrasound guided core biopsy 06/29/15  5.  Status post left breast stereotactic guided core biopsy 06/29/15 - benign  6.  Personal history of melanoma right great toe, status post amputation.        Plan:  1. The patient's pathology was reviewed and all questions were answered.  She was given a copy of the report.  2. Return to clinic three months, sooner if needed  3. Upper extremity restrictions were reviewed with the patient, she was given a handout  4. Patient was given a post operative exercise handout  5. Upper extremity measurements were taken/documented  6. Begin mammograms one year after reconstruction is complete  7. Reconstruction per plastic surgery  8. Patient will require adjuvant endocrine therapy  9. No need for radiation therapy  10. Continue monthly self exams  11. Genetic testing pending    A total of 10 minutes was spent face to face/involved with this patient, Greater than 50% was spent counseling, discussing imaging findings/report, reviewing her chart and answering her questions.

## 2015-08-29 ENCOUNTER — Encounter: Attending: Medical Oncology

## 2015-08-29 NOTE — Telephone Encounter (Signed)
Left message for patient to call and rescheduled today's missed appt lmf

## 2015-08-30 NOTE — Telephone Encounter (Signed)
noted 

## 2015-09-13 ENCOUNTER — Encounter: Attending: Internal Medicine

## 2015-09-14 NOTE — Telephone Encounter (Signed)
Patient called to reschedule a Follow Up appointment. Appointment has been rescheduled to 10/02/2015. Patient has requested a reminder call for appointment. Patient can be reached at [phone].

## 2015-09-27 NOTE — Telephone Encounter (Signed)
Left msg that Dr Arman Bogus is out on 8/22.  Asked for call back to rsc possibly with Olivia Mackie, NP on 8/21, 8/24 or another day.  lmf

## 2015-10-02 ENCOUNTER — Encounter: Attending: Medical Oncology

## 2015-10-04 ENCOUNTER — Ambulatory Visit: Admit: 2015-10-04 | Discharge: 2015-10-04 | Payer: PRIVATE HEALTH INSURANCE | Attending: Internal Medicine

## 2015-10-04 DIAGNOSIS — Z01818 Encounter for other preprocedural examination: Secondary | ICD-10-CM

## 2015-10-04 NOTE — Progress Notes (Signed)
Karen Bright is a 55 y.o.  female  Presenting today for a preop visit.  she has scheduled left breast reconstruction.  Procedure indicated for breast cancer.  Procedure will  be performed by Dr. Candice Camp  on 10/16/2015 at [facility]    No Known Allergies          Past Surgical History:   Procedure Laterality Date   ??? APPENDECTOMY  1971   ??? BREAST BIOPSY  2017   ??? MASTECTOMY, BILATERAL Bilateral 08/09/2015    : BILATERAL BREAST MASTECTOMY, LEFT BREAST SENTINEL LYMPH NODE   ??? TOE AMPUTATION Right 2013    right great toe with node removal       Patient Active Problem List   Diagnosis   ??? Malignant neoplasm of left female breast ([place])   ??? COPD, mild (HCC)-dx PFT's [place] 2017       Previous steroids no  Asthma or wheezing no. Mild COPD  Personal or family hx of prolonged bleeding no  Personal or family hx of adverse reaction to anesthesia no  Immunization deficiency none    Patient's medications, allergies, past medical, surgical, social and family histories were reviewed and updated as appropriate.    Current Outpatient Prescriptions on File Prior to Visit   Medication Sig Dispense Refill   ??? TRIAMCINOLONE ACETONIDE EX Apply topically       No current facility-administered medications on file prior to visit.        Complete ROS negative  BP (!) 82/58 (Site: Left Arm, Position: Sitting, Cuff Size: Medium Adult)   Pulse 85   Temp 98.3 ??F (36.8 ??C)   Ht 5' 3"  (1.6 m)   Wt 220 lb (99.8 kg)   LMP 08/08/2012   SpO2 99%   BMI 38.97 kg/m2  Wt 220 lb (99.8 kg)   LMP 08/08/2012   BMI 38.97 kg/m2  Gen: well nourished, comfortable  Nose: Normal mucosa, no deformity   Mouth/Throat: Oropharynx is clear and moist, and mucous membranes are normal. Normal dentition, no concerning lesions  Eyes: Conjunctivae and extraocular motions are normal. Pupils are equal, round, and reactive to light.   Ears: normal canals and TM's bilaterally  Neck: Neck supple. No mass  Lymph: no cervical or supraclavicular adenopathy  Cardiovascular: S1S2 with no  murmurs, rubs or gallops  Pulmonary/Chest: Effort normal and breath sounds normal. . She has no wheezes, rhonchi or rales.   Abdominal: Soft, non-tender. Bowel sounds are normal. She exhibits no organomegaly or mass  Genitourinary: deferred  Musculoskeletal: Normal range of motion, no synovitis.  Neurological:  CN 2-12 grossly intact   Coordination normal. Normal tone  Skin: Skin is warm and dry. There is no rash or erythema.  No suspicious lesions noted.   Psychiatric: behavior appropriate for age     Assessment and Plan    Low risk patient may proceed with moderate risk procedure  No nsaids for 7 days prior to surgery.  Nothing but tylenol  ecg with poor rwave progression,  NSR  Vigorous exercise regularly 7 MET-no cardiac concerns

## 2015-10-05 ENCOUNTER — Encounter: Attending: Internal Medicine

## 2015-11-23 ENCOUNTER — Ambulatory Visit: Admit: 2015-11-23 | Discharge: 2015-11-23 | Payer: PRIVATE HEALTH INSURANCE | Attending: Surgery

## 2015-11-23 DIAGNOSIS — Z853 Personal history of malignant neoplasm of breast: Secondary | ICD-10-CM

## 2015-11-23 NOTE — Progress Notes (Signed)
Patient Name: Karen Bright  Date of Birth: [birth year]  Primary Care Physician: Gilman Schmidt, NP   Medical Oncology: Caleen Essex, MD  Plastic Surgery: Mervyn Skeeters, MD  ??  ??  Subjective: Patient presents for follow up.  It has been three months since her surgery. She is doing well.  She has already undergone her expander/implant exchange.  She is getting ready to move out of town.  She reports no complaints at this time.    She completed genetic testing and it was negative.     She states that she has decided not to take endocrine therapy.    ??  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  ??  Vitals:    11/23/15 1123   BP: 107/73   Pulse: 66   Weight: 200 lb (90.7 kg)   Height: 5\' 5"  (1.651 m)     ??  ??  ??  ROS  Constitutional: no weight loss, fever, night sweats   Skin: negative  Cardiovascular: no chest pain or palpitations   Pulmonary: No cough, sputum, or hemoptysis   GI:No abdominal pain  Breast: see above  All other systems were reviewed and are negative  ??  ??  Objective:  Breast:  Bilateral breastreconstruction are symmetrical. The bilateral nipples are surgically abscent. The bilateral implants are intact.  The bilateral mastectomy flaps are healthy and the incisions are well healed.  The skin bilaterally is without erythema/thickening (peau d'orange)/dimpling. There are no palpable masses/lesions bilaterally.  Axilla:  Bilateral axilla without palpable adenopathy. Extremity: Left upper extremity without evidence of lymphedema.  Good range of motion.  Good grip and upper extremity muscle strength  General: AAO x 3, NAD, well nourished  Heart: RRR, no murmurs/gallops/rubs  Lungs: CTA B/L, no wheezes, rhonchi, rales   Neck: Supple, No JVD/Thyromegaly, No bruits  Psyche:  Good mood/Positive attitude  Skin: No  rashes    ??  ??  ??  Assessment:   1.  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  Status post bilateral mastectomy (left therapeutic/right prophylactic) with left sentinel lymph node biopsy 08/09/2015  3.  Status post bilateral first stage immediate reconstruction with tissue expander placement (Dr. Candice Camp) 08/09/15  4.  Status post left breast ultrasound guided core biopsy 06/29/15  5.  Status post left breast stereotactic guided core biopsy 06/29/15 - benign  6.  Personal history of melanoma right great toe, status post amputation.    7. Genetic testing negative (80 gene panel)  July, 2017  ??  ??  Plan:  1. Return to clinic three months, sooner if needed.  As the patient is moving out of town I have recommended she establish care at her new location.    2. Upper extremity measurements were taken/documented  3. Begin mammograms one year after reconstruction is complete  4. Reconstruction per plastic surgery.  If patient chooses to complete any additional reconstruction (nipple)  5. Adjuvant endocrine therapy - patient had decided not to take any endocrine therapy at this time.    6. Continue monthly self exams  7. Genetics completed and negative       ??  A total of 10 minutes was  spent face to face/involved with this patient, Greater than 50% was spent counseling, discussing imaging findings/report, reviewing her chart and answering her questions.

## 2015-11-23 NOTE — Patient Instructions (Signed)
Patient Education        Breast Self-Exam: Care Instructions  Your Care Instructions  A breast self-exam is when you check your breasts for lumps or changes. This regular exam helps you learn how your breasts normally look and feel. Most breast problems or changes are not because of cancer.  Breast self-exam is not a substitute for a mammogram. Having regular breast exams by your doctor and regular mammograms improve your chances of finding any problems with your breasts.  Some women set a time each month to do a step-by-step breast self-exam. Other women like a less formal system. They might look at their breasts as they brush their teeth, or feel their breasts once in a while in the shower.  If you notice a change in your breast, tell your doctor.  Follow-up care is a key part of your treatment and safety. Be sure to make and go to all appointments, and call your doctor if you are having problems. It???s also a good idea to know your test results and keep a list of the medicines you take.  How do you do a breast self-exam?  ?? The best time to examine your breasts is usually one week after your menstrual period begins. Your breasts should not be tender then. If you do not have periods, you might do your exam on a day of the month that is easy to remember.  ?? To examine your breasts:  ?? Remove all your clothes above the waist and lie down. When you are lying down, your breast tissue spreads evenly over your chest wall, which makes it easier to feel all your breast tissue.  ?? Use the pads???not the fingertips???of the 3 middle fingers of your left hand to check your right breast. Move your fingers slowly in small coin-sized circles that overlap.  ?? Use three levels of pressure to feel of all your breast tissue. Use light pressure to feel the tissue close to the skin surface. Use medium pressure to feel a little deeper. Use firm pressure to feel your tissue close to your breastbone and ribs. Use each pressure level to  feel your breast tissue before moving on to the next spot.  ?? Check your entire breast, moving up and down as if following a strip from the collarbone to the bra line, and from the armpit to the ribs. Repeat until you have covered the entire breast.  ?? Repeat this procedure for your left breast, using the pads of the 3 middle fingers of your right hand.  ?? To examine your breasts while in the shower:  ?? Place one arm over your head and lightly soap your breast on that side.  ?? Using the pads of your fingers, gently move your hand over your breast (in the strip pattern described above), feeling carefully for any lumps or changes.  ?? Repeat for the other breast.  ?? Have your doctor inspect anything you notice to see if you need further testing.  Where can you learn more?  Go to [email or web address] and sign in to your MyChart account. Enter P148 in the Search Health Information box to learn more about "Breast Self-Exam: Care Instructions."     If you do not have an account, please click on the "Sign Up Now" link.  Current as of: September 05, 2014  Content Version: 11.3  ?? 2006-2017 [employer]. Care instructions adapted under license by [facility]. If you have questions about a medical condition or this   instruction, always ask your healthcare professional. [employer] disclaims any warranty or liability for your use of this information.

## 2021-01-14 NOTE — Telephone Encounter (Signed)
Formatting of this note might be different from the original.  Patient called and cancelled appt on 01/14/2021 and did not want to reschedule.  She said she is fine  Electronically signed by Enrique Sack at 01/14/2021 11:17 AM EST

## 2022-04-04 IMAGING — MR MRI THORACIC SPINE W/WO CONTRAST
5 of 12 series · 20 of 48 positions shown · IV contrast (gadolinium)
Comparison: MRI cervical spine and MRI lumbar spine from today.

Referring: MAKU, YAPARA                
________________________________________________________________________________________________ 
MRI THORACIC SPINE W/WO CONTRAST, 04/04/2022 [DATE]: 
CLINICAL INDICATION: Thoracic spine pain. 
Evaluate for metastatic disease. History of thyroid cancer.
TECHNIQUE: Multiplanar, multiecho position MR images of the thoracic spine were 
performed without and with intravenous gadolinium enhancement.  10 mL of 
Gadavist were injected intravenously by hand. 0 mL of Gadavist were discarded. 
Patient was scanned on a 1.5T magnet.

[Series 102: T2 · sagittal · 4.0mm · 0.62mm/px · 1 of 11 slices shown (1 of 2)]
[im 1/11]
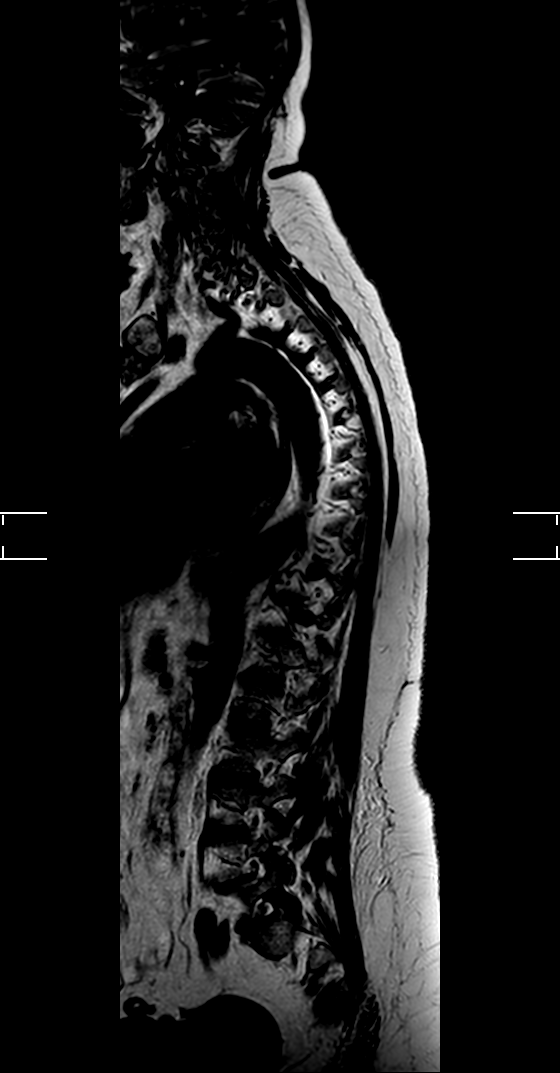

[Series 301: T1 · sagittal · 3.0mm · 0.56mm/px · 2 of 21 slices shown (1 of 2)]
[im 1/21]
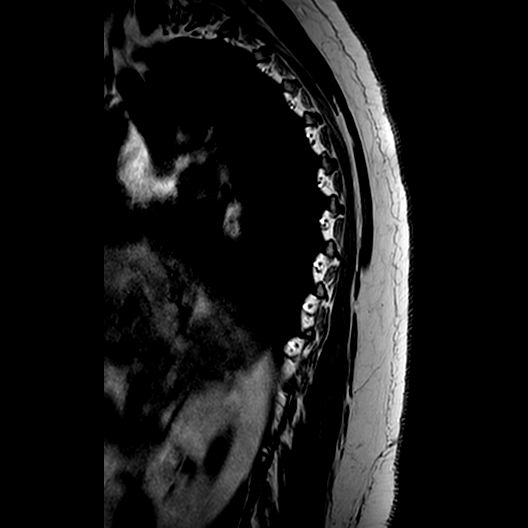
[im 21/21]
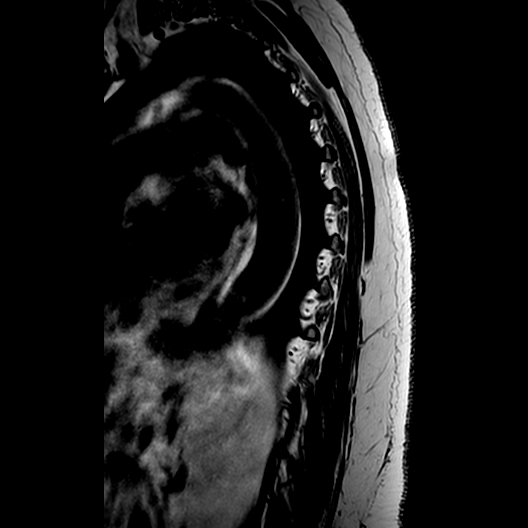

[Series 601: T2 · axial · 4.0mm · 0.42mm/px · z∈[-249,-28]mm · 7 of 71 slices shown (2 of 2)]
[im 1/71]
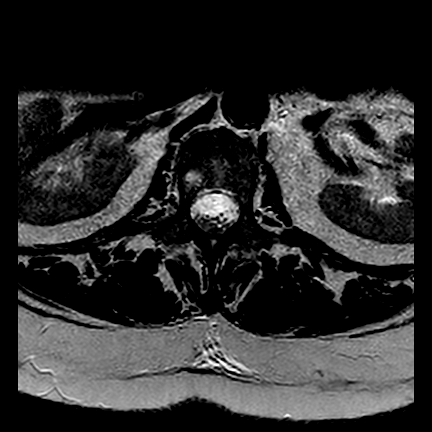
[im 12/71]
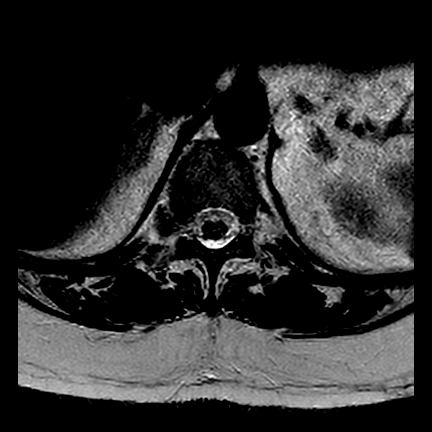
[im 24/71]
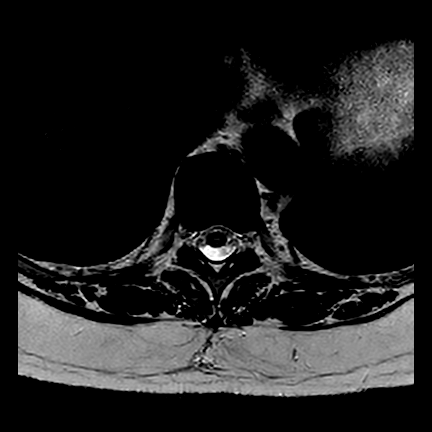
[im 36/71]
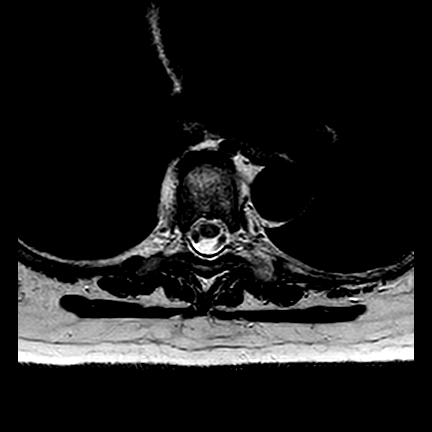
[im 47/71]
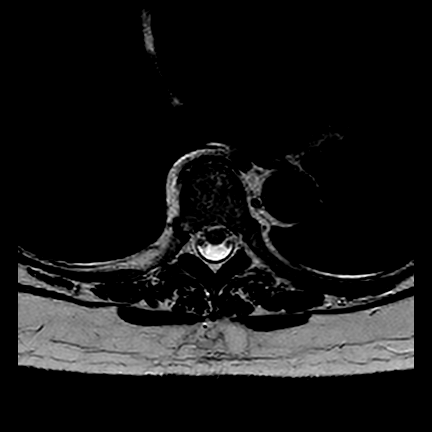
[im 59/71]
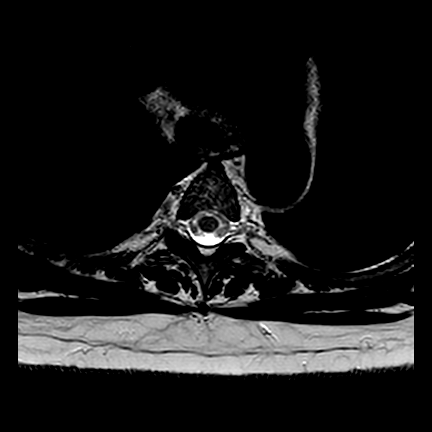
[im 71/71]
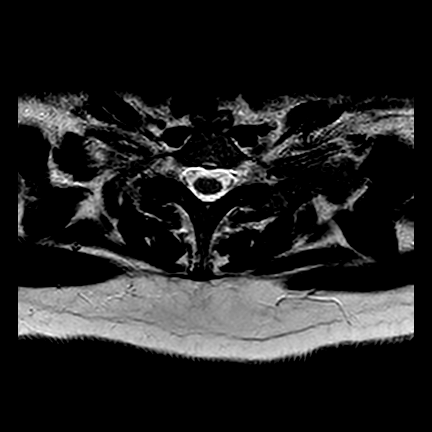

[Series 701: T1 · axial · 4.0mm · 0.38mm/px · z∈[-249,-28]mm · 7 of 71 slices shown (2 of 2)]
[im 1/71]
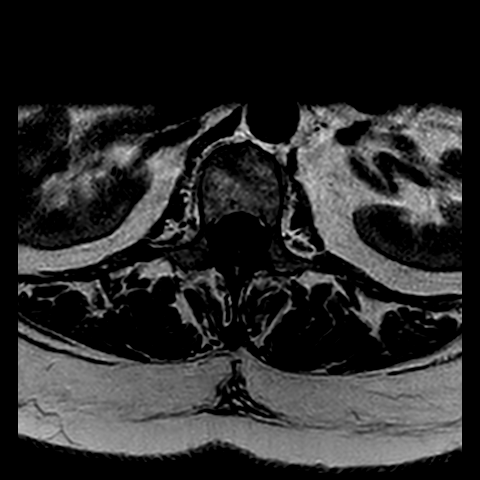
[im 12/71]
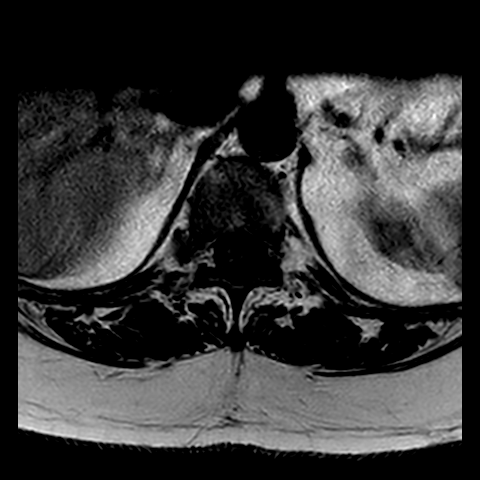
[im 24/71]
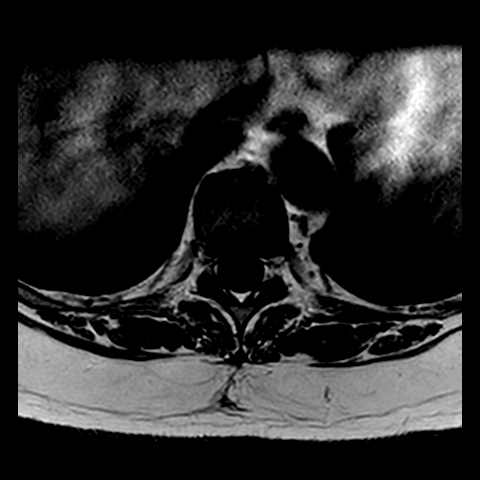
[im 36/71]
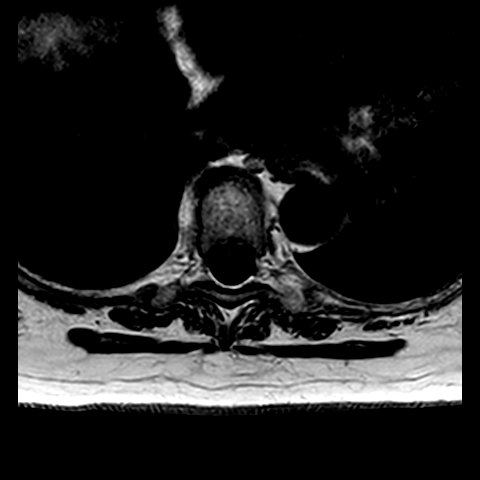
[im 47/71]
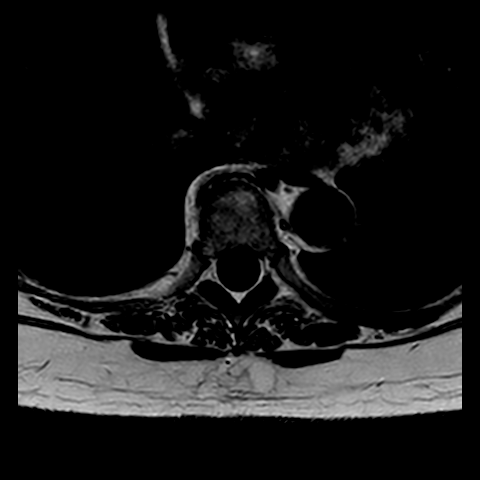
[im 59/71]
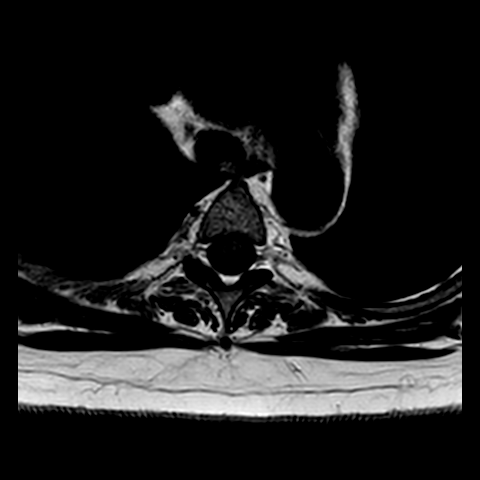
[im 71/71]
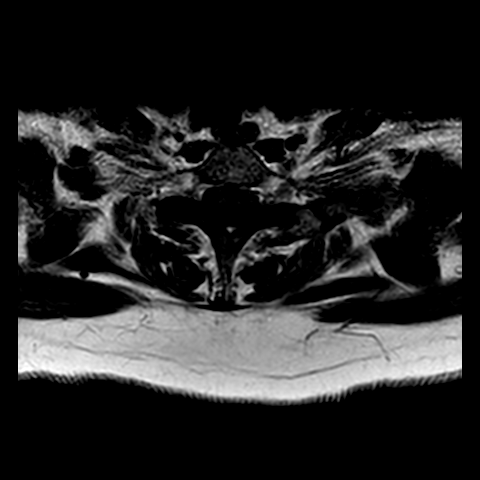

[Series 1801: T1 post-contrast · axial · 4.0mm · 0.38mm/px · z∈[-249,-158]mm · 3 of 71 slices shown]
[im 1/71]
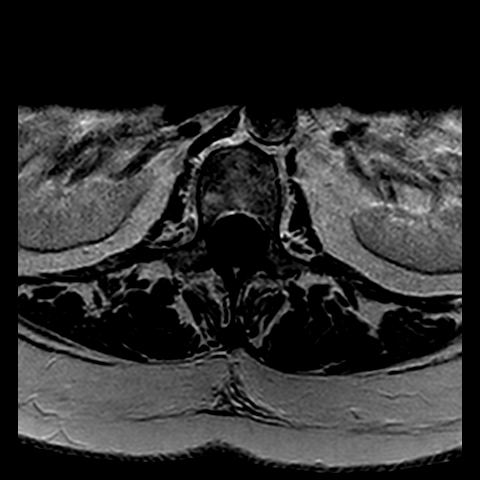
[im 12/71]
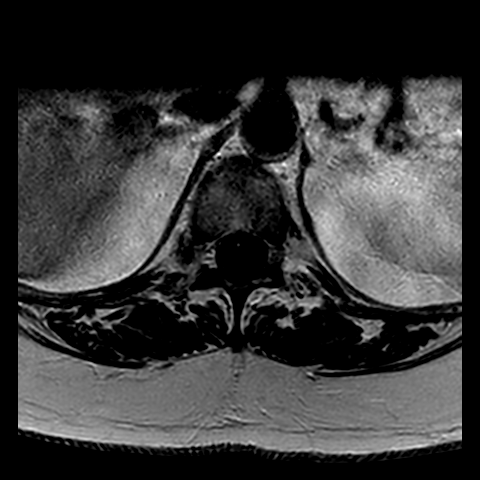
[im 24/71]
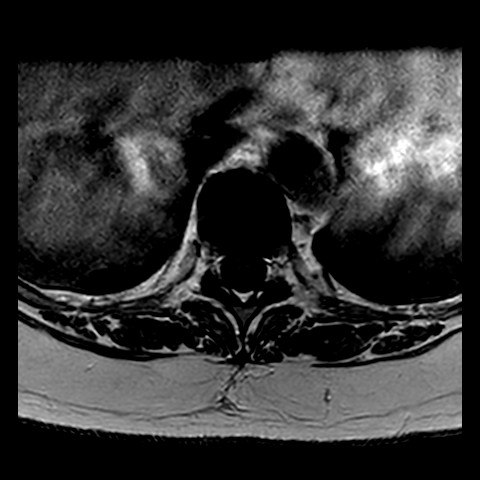

[20 of 48 positions shown; findings below may reference images not displayed]

FINDINGS: 12 thoracic and 5 lumbar type vertebral bodies. 
-------------------------------------------------------------------------------- 
------ 
GENERAL: 
ALIGNMENT: Normal coronal alignment. Accentuated thoracic kyphosis. 
VERTEBRAL BODY HEIGHT: Normal.  
MARROW SIGNAL: 7 mm enhancing lesion within L1, described on comparison MR 
lumbar spine. Similar, 8 mm enhancing lesion within the T5 vertebral body., 
Indeterminant. There is another, similar, enhancing lesion measuring 9 mm 
involving the left T12 pedicle. There appears to be another, similar, 7 mm 
lesion within the spinous process of T12. 
CORD SIGNAL: Normal. 
ADDITIONAL FINDINGS: Membrane thickening posterior aspect of the sphenoid sinus 
locule.. The uterus appears enlarged. It is incompletely visualized, however. 
-------------------------------------------------------------------------------- 
------ 
RELEVANT SEGMENTAL (levels with severe stenosis or significant findings): 
Mild-to-moderate multilevel variable degrees of loss of disc height and loss of 
disc signal, as well as endplate signal changes, present throughout the thoracic 
spine, but most significant at T5-T6, T6-T7 and T7-T8 levels consistent with DDD 
change. 
There is no significant or critical central canal or foraminal stenosis. No 
evidence of active facet arthritis. 
T6-T7: Central disc extrusion abuts and indents the ventral cord margin. It 
measures 3 mm in AP dimension. Mild canal stenosis. 
There are minimal annular bulges at other levels, but none of which abut or 
distort the ventral cord margin.
IMPRESSION: 3 separate indeterminate, enhancing lesions within the thoracic spine. Early 
metastatic disease would be difficult to exclude. Consider correlation with CT 
thoracic spine, to evaluate the osseous architecture, and bone scan. 
Precautionary short-term follow-up study with MRI of the thoracic spine without 
and with gadolinium, in 2-3 months could be performed. 
Thoracic degenerative changes detailed above. 
The uterus is enlarged, but incompletely visualized. Follow-up pelvic ultrasound 
recommended.

## 2022-04-04 IMAGING — MR MRI LUMBAR SPINE W/WO CONTRAST
7 of 12 series · 16 of 48 positions shown · IV contrast (Gadolinium)
Comparison: MRI thoracic spine from today.

Referring: PERDUE, NEG                
________________________________________________________________________________________________ 
******** ADDENDUM #1 ********/n 
Addendum: Precautionary follow-up MR lumbar spine in 2-3 months, without with 
gadolinium, recommended to assess for stability. 
MRI LUMBAR SPINE W/WO CONTRAST, 04/04/2022 [DATE]: 
CLINICAL INDICATION: Thyroid cancer. Evaluate for metastatic disease. Lumbar 
pain.
TECHNIQUE: Multiplanar, multiecho position MR images of the lumbar spine were 
performed without and with 10 mL of Gadavist were injected intravenously by 
hand. 0 mL of Gadavist were discarded. Patient was scanned on a 1.5T magnet.

[Series 801: survey · axial · 10.0mm · 1.21mm/px · 1 of 10 slices shown]
[im 1/10]
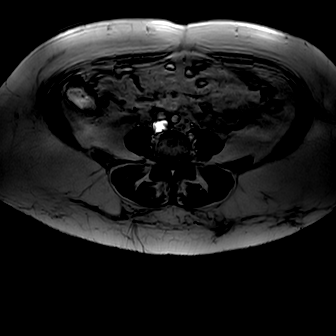

[Series 901: t2w_cor-surv · coronal · 6.0mm · 0.60mm/px · 2 of 14 slices shown]
[im 1/14]
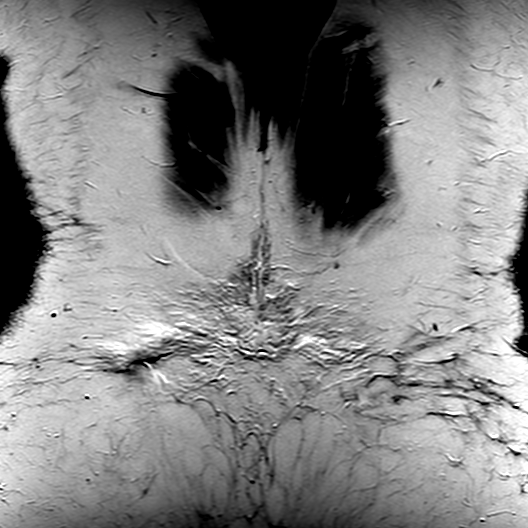
[im 14/14]
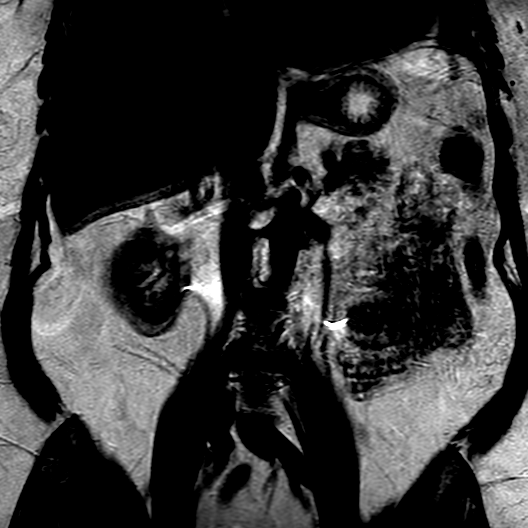

[Series 1001: T1 · sagittal · 4.0mm · 0.46mm/px · 2 of 17 slices shown (1 of 2)]
[im 1/17]
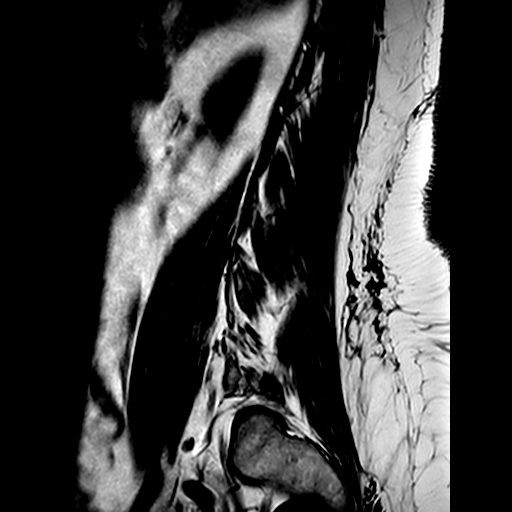
[im 17/17]
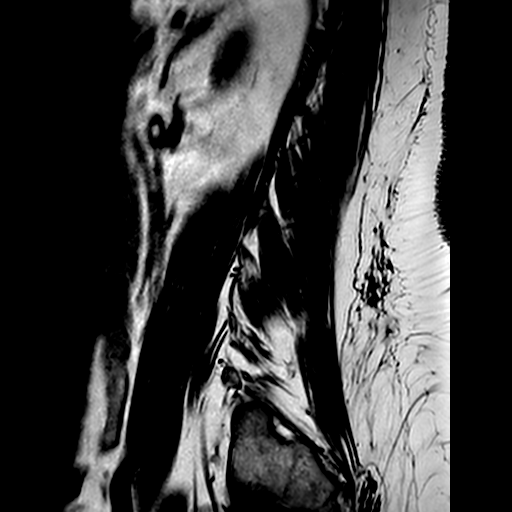

[Series 1102: (id)_mdixon_tse · sagittal · 4.0mm · 0.37mm/px · 2 of 17 slices shown]
[im 1/17]
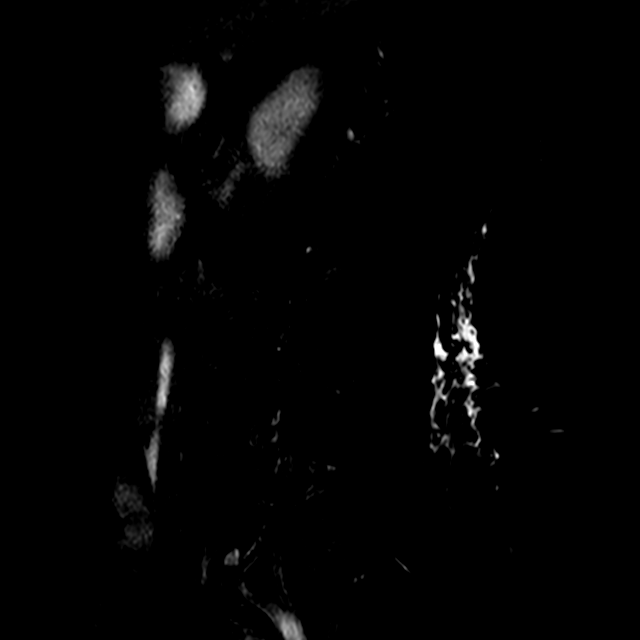
[im 17/17]
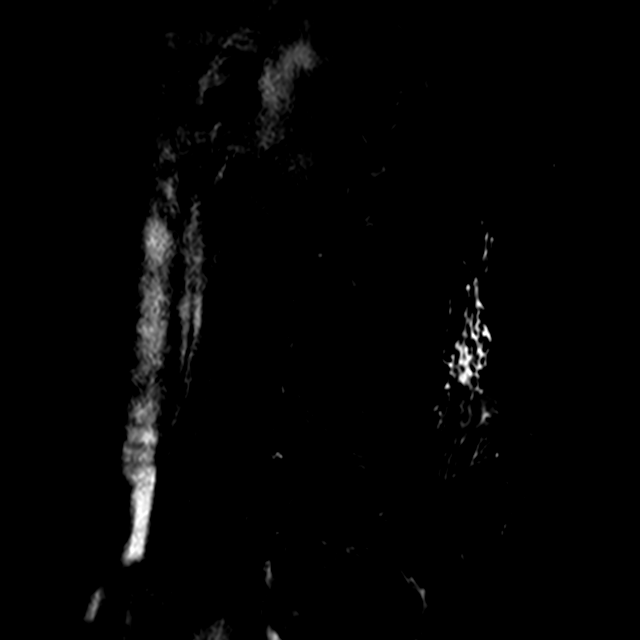

[Series 1301: T2 · oblique · 4.0mm · 0.30mm/px · 3 of 30 slices shown]
[im 1/30]
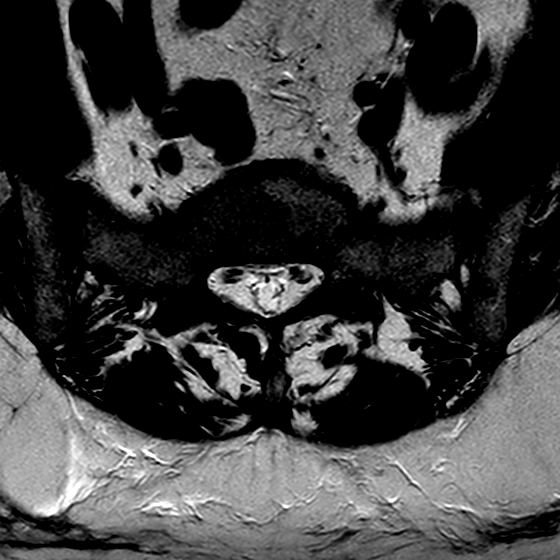
[im 15/30]
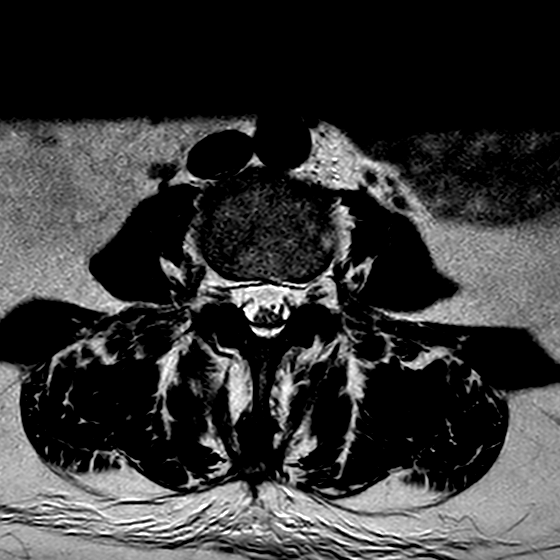
[im 30/30]
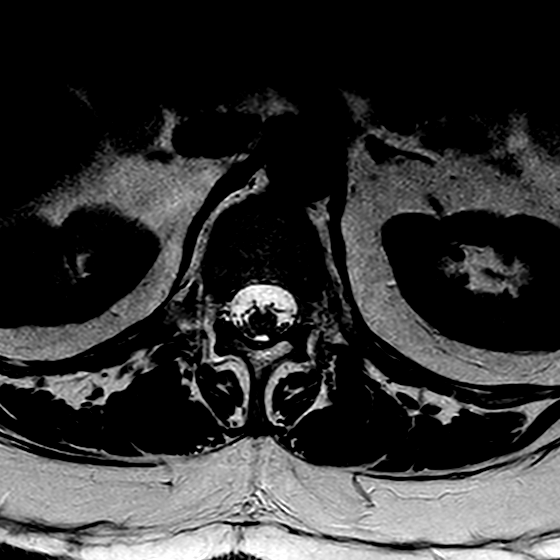

[Series 1401: T1 · oblique · 4.0mm · 0.33mm/px · 3 of 30 slices shown (2 of 2)]
[im 1/30]
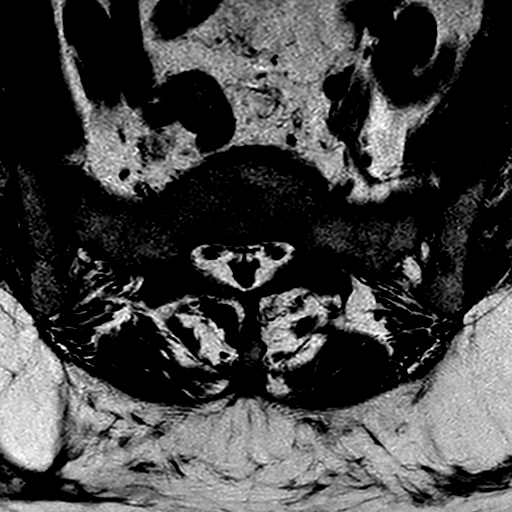
[im 15/30]
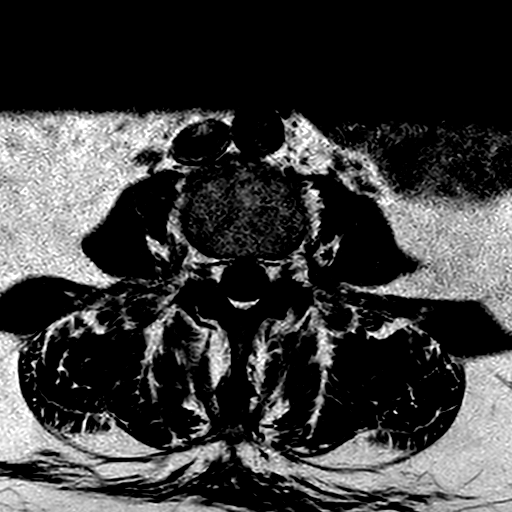
[im 30/30]
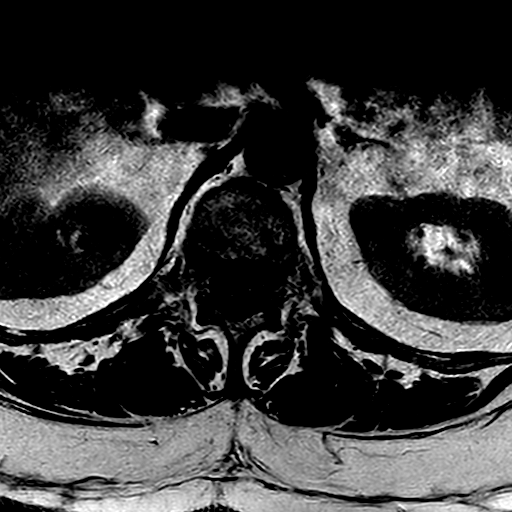

[Series 1601: T1 post-contrast · oblique · 4.0mm · 0.33mm/px · 3 of 30 slices shown]
[im 1/30]
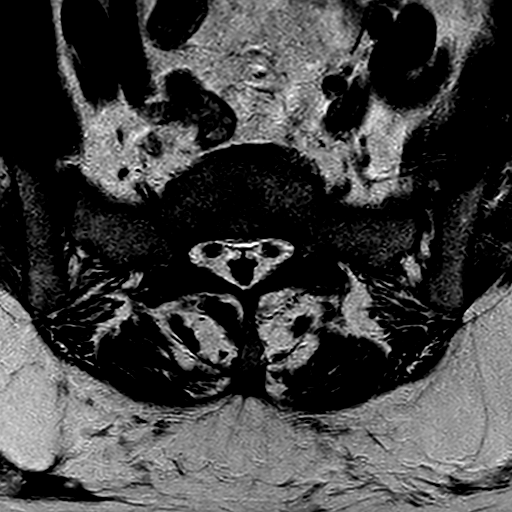
[im 15/30]
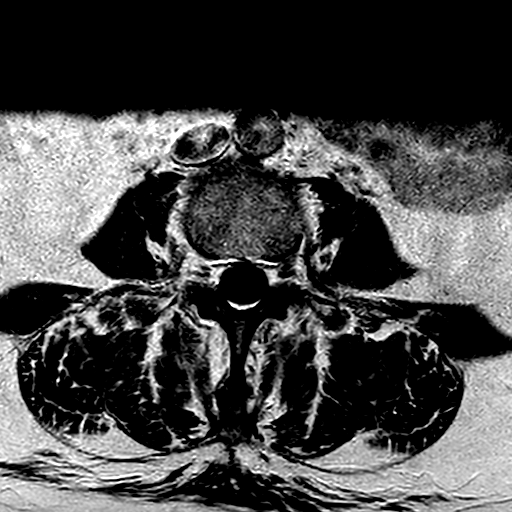
[im 30/30]
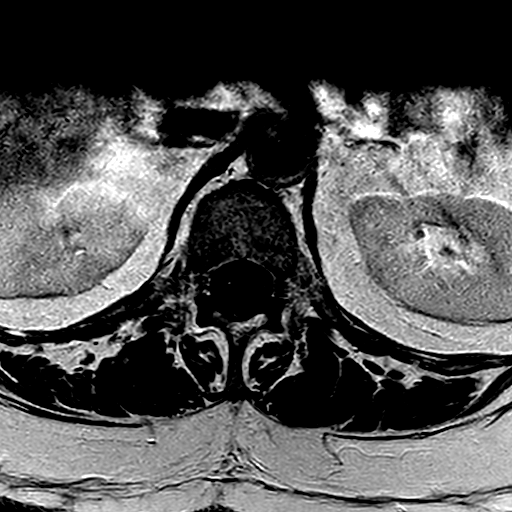

[16 of 48 positions shown; findings below may reference images not displayed]

FINDINGS: -------------------------------------------------------------------------------- 
------ 
GENERAL: 
Nomenclature is based on 5 lumbar type vertebral bodies.     
ALIGNMENT: Normal coronal alignment. Trace grade 1 retrolisthesis L2 on L3 and 
L3 on L4. 
VERTEBRAL BODY HEIGHT: Normal.  
MARROW SIGNAL: There is a 7 mm T1 isointense, T2 hyperintense, and mildly 
enhancing lesion in the L1 vertebral body, image 11 series 1746, which is 
indeterminate. 
CORD SIGNAL: Normal distal spinal cord and cauda equina. Conus medullaris 
terminates at L1. 
ADDITIONAL FINDINGS: There is soft tissue enhancement which is subtle, and 
adjacent to the L3-L4 facets bilaterally which could indicate active facet 
arthritis with similar, but less significant type enhancement adjacent to the 
L4-L5 facets bilaterally.. 
Modic I-II: L4-L5, L5-S1. 
Ligamentum Flavum > 2.5 mm: All levels. 
-------------------------------------------------------------------------------- 
------ 
SEGMENTAL: 
T12-L1: Loss of disc signal. Minimal annular bulge. Canal and foramina are 
patent. Normal facets. 
L1-L2: Loss of disc signal. Minimal annular bulge. Canal and foramina are 
patent. Normal facets. 
L2-L3: Loss of disc signal. Mild annular bulge. Canal and foramina are patent. 
Small right foraminal disc protrusion. Small bilateral facet joint effusions. 
L3-L4: Mild loss of disc height with slight loss of disc signal. Minimal annular 
bulge. Small bilateral facet joint effusions. Foramina patent. 
L4-L5: Moderate loss of disc height to the left. Loss of disc signal with 
endplate irregularity. Borderline canal stenosis. Facet arthropathy with tiny 
bilateral facet joint effusions. Mild left foraminal narrowing. Patent right 
foramen. 
L5-S1: Loss of disc signal. Canal and foramina are patent. Small bilateral facet 
joint effusions. 
-------------------------------------------------------------------------------- 
------
IMPRESSION: Single indeterminate 7 mm lesion in the L1 vertebral body. This demonstrates 
enhancement. Could consider dedicated CT lumbar spine to further evaluate the 
osseous architecture and correlate with possible bone scan. No other potential 
evidence of metastatic disease within the lumbar spine. 
There is minimal perifacet enhancement bilaterally L3-L4 and L4-L5 which may 
suggest active facet arthritis. 
Lumbar degenerative changes but without significant canal or foraminal 
stenosis.. The uterus appears enlarged but is incompletely visualized. Pelvic 
ultrasound recommended.

## 2022-04-07 IMAGING — MR MRI BRAIN W/WO CONTRAST
2 of 17 series · 5 of 48 positions shown · IV contrast (gadavist)
Comparison: No prior cranial examination

Referring: GULART, VINNICIUS                
________________________________________________________________________________________________ 
MRI BRAIN W/WO CONTRAST,04/07/2022 [DATE]: 
CLINICAL INDICATION: Headache. Evaluate for metastasis. Bilateral mastectomy. 
Melanoma.
TECHNIQUE: Multiplanar, multiecho position MR images of the brain were performed 
without and with 10 mL of Gadavist were injected intravenously. 0 mL of Gadavist 
was discarded.  Patient was scanned on a 1.5T magnet.

[Series 1902: T1 post-contrast · coronal · 1.0mm · 0.24mm/px · 3 of 180 slices shown (1 of 2)]
[im 30/180]
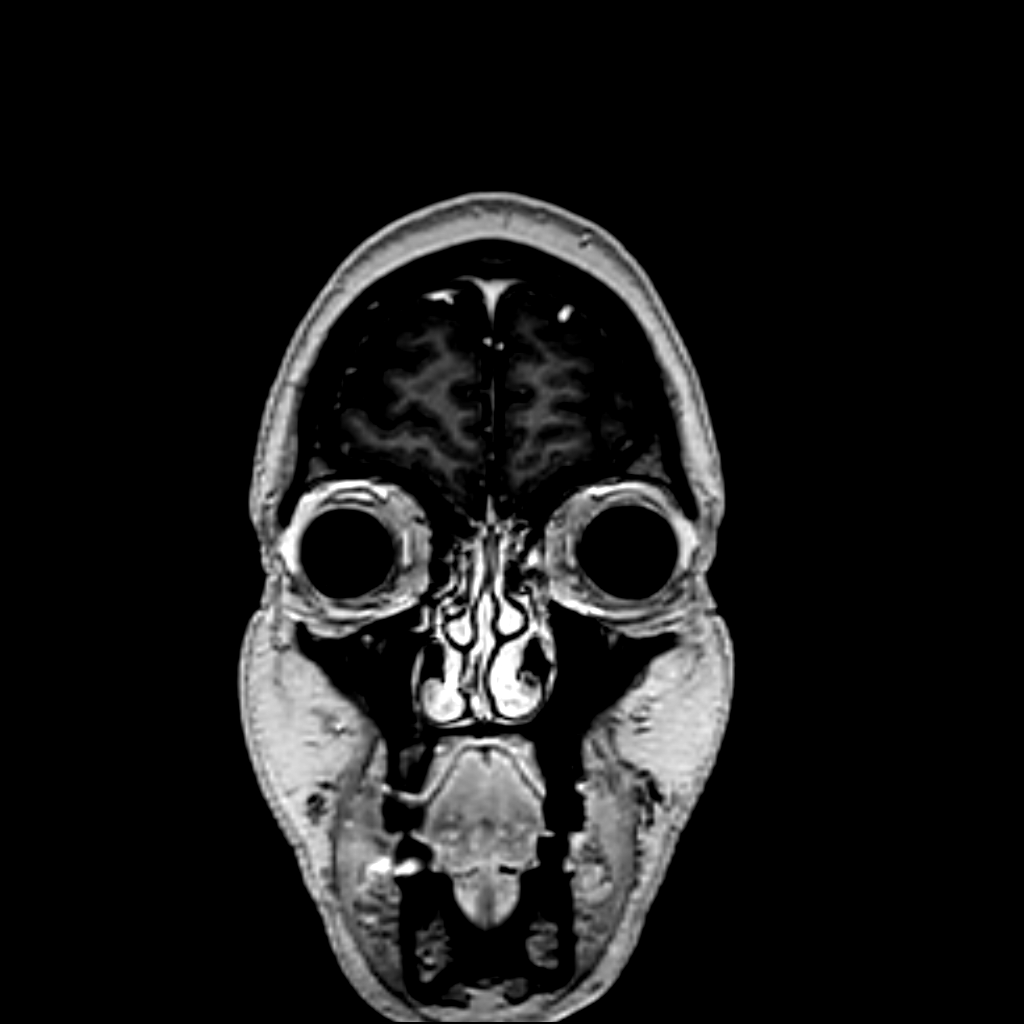
[im 90/180]
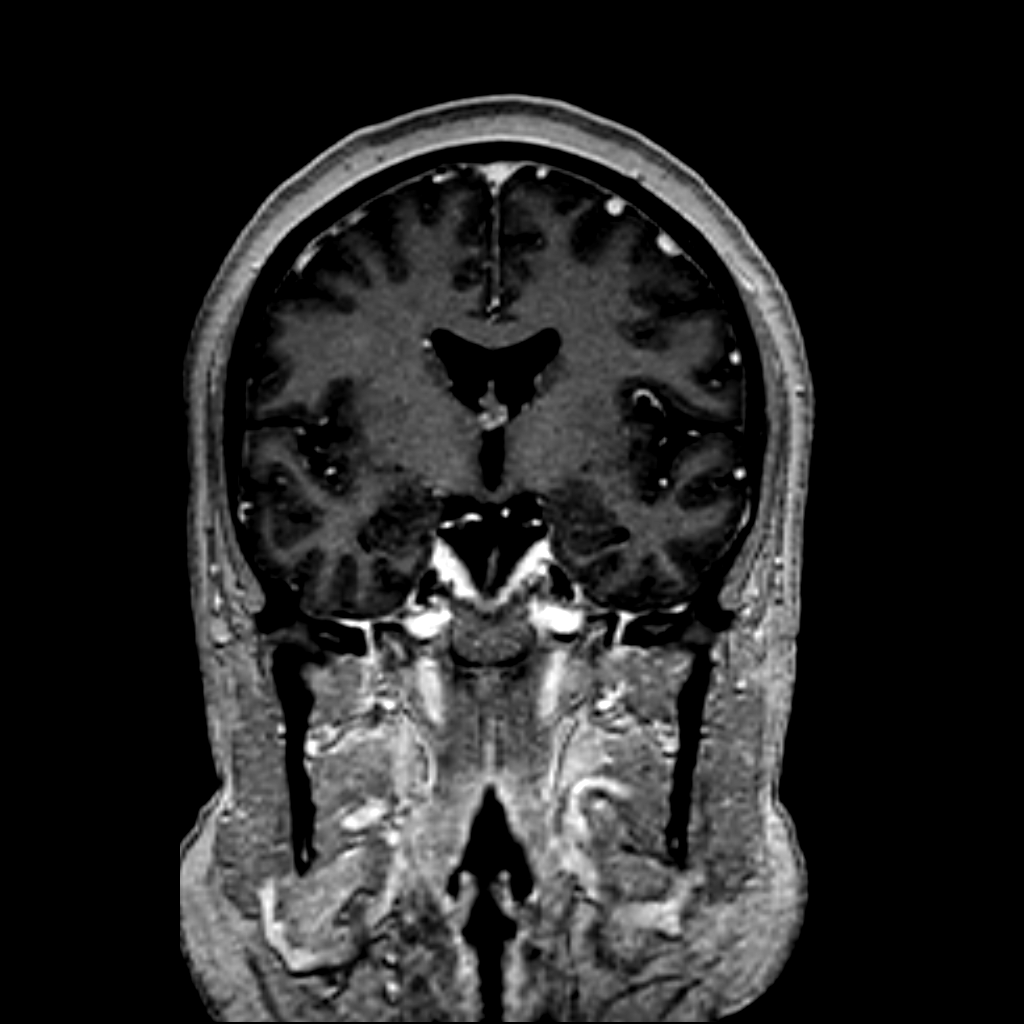
[im 150/180]
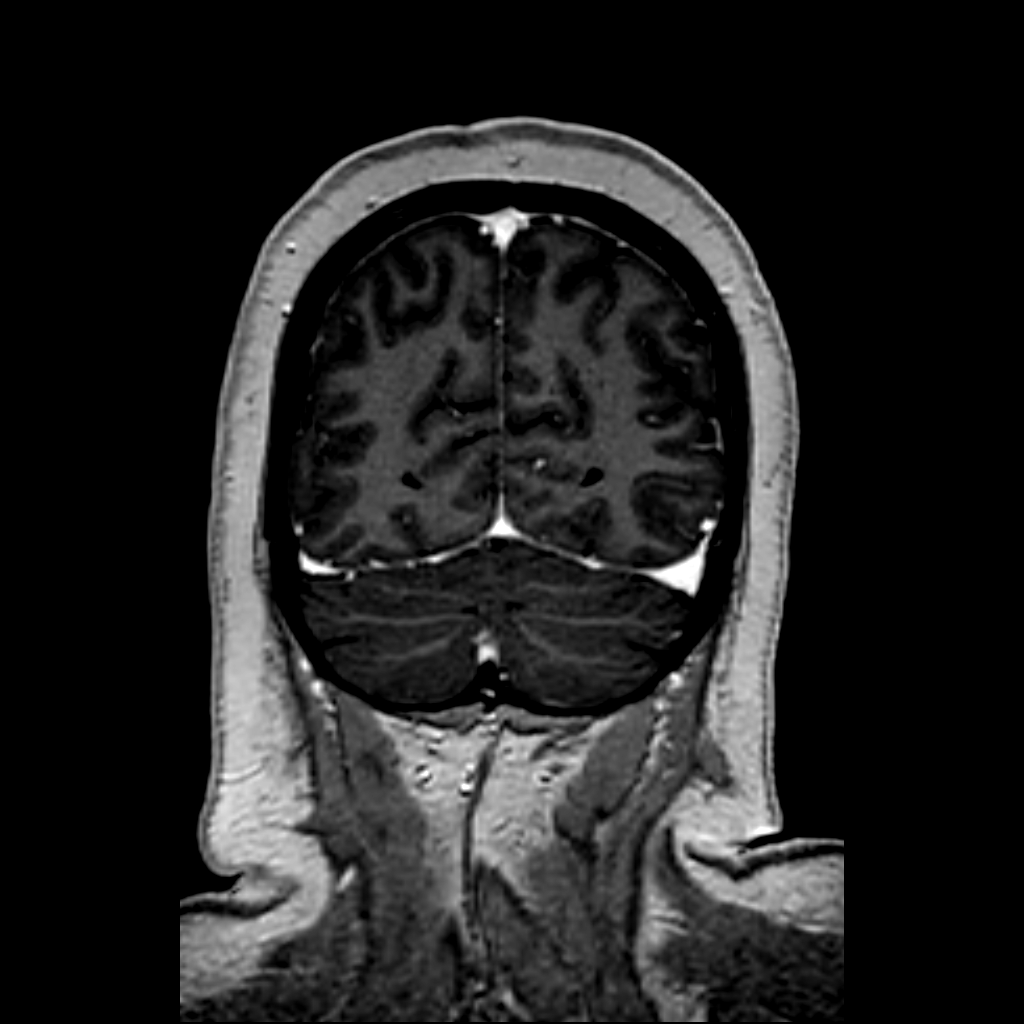

[Series 1903: T1 post-contrast · axial · 1.0mm · 0.24mm/px · z∈[-24,+32]mm · 2 of 170 slices shown (2 of 2)]
[im 29/170]
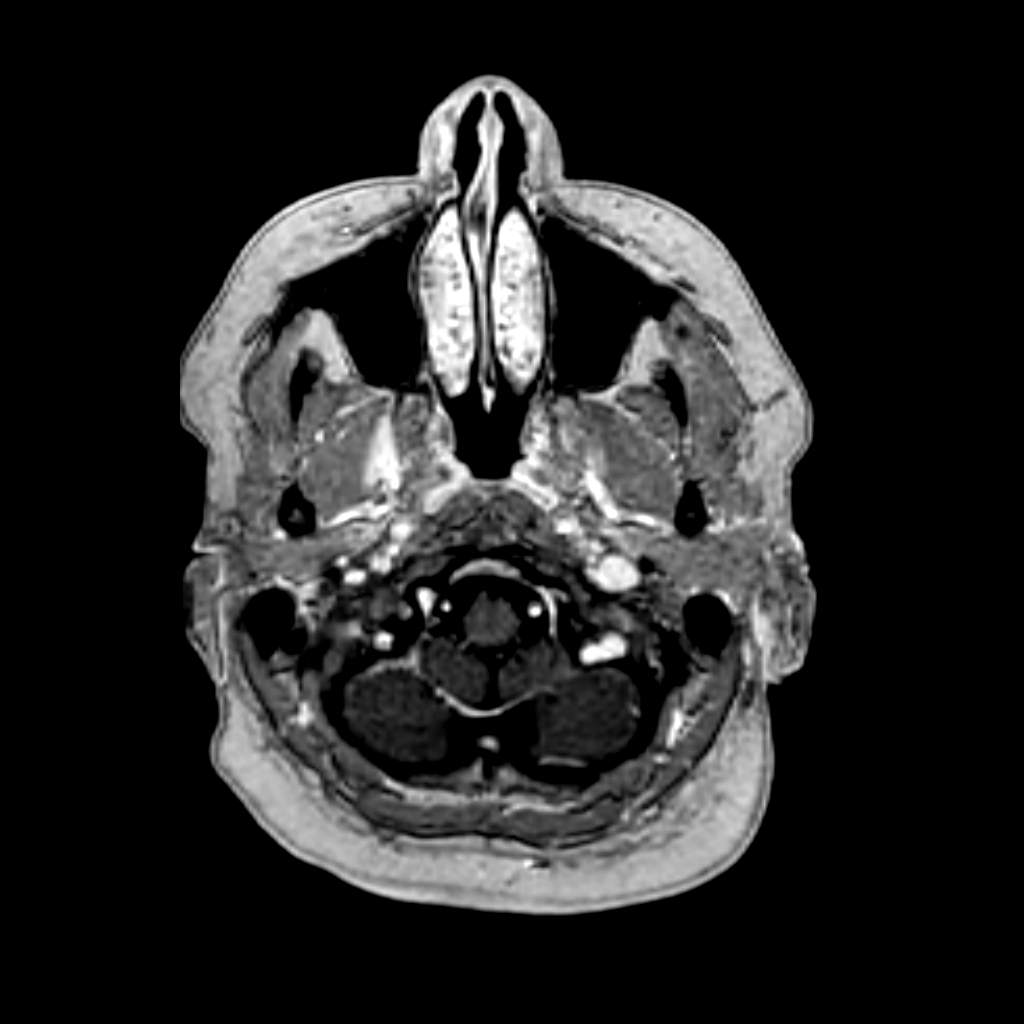
[im 85/170]
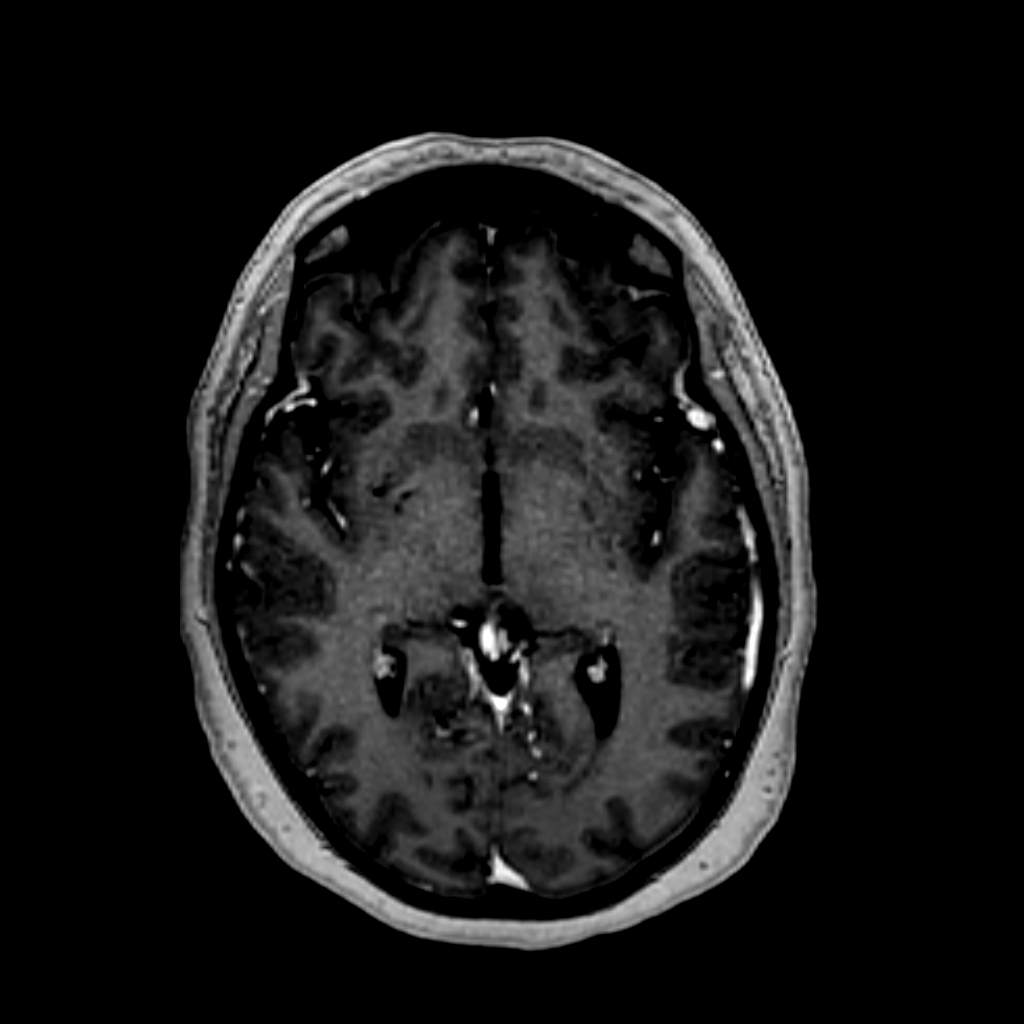

[5 of 48 positions shown; findings below may reference images not displayed]

FINDINGS: Diffusion-weighted images are negative. Susceptibility images are 
negative. There are mild T2 hyperintense signal foci in the cerebral white 
matter, predominantly in the anterior frontal white matter. There is no 
hydrocephalus. Major intracranial arterial segments are open. Dural sinuses 
appear open. No discrete brainstem or cerebellar findings. There is no 
significant cerebral or cerebellar atrophy. 
No specific findings to indicate metastasis. There is a 12 by 9 x 6 mm 
extra-axial lesion overlying the left middle frontal gyrus, with mild deformity 
of subjacent cortex. There is no associated edema. This most likely represents 
meningioma. No other intracranial mass. 
There is mild mucosal thickening in the left sphenoid sinus. Other paranasal 
sinuses, tympanic cavities and mastoid air cells are clear. No orbital mass. The 
craniocervical junction is open. Sellar contents normal.
IMPRESSION: 12 x 9 mm extra-axial lesion overlying the left frontal lobe with mild deformity 
of subjacent cortex. This is most likely meningioma. There is no surrounding 
edema. 
No evidence for other intracranial mass, hydrocephalus or recent infarct. 
Mild T2 hyperintense signal foci in the anterior frontal white matter 
bilaterally are nonspecific, likely early microangiopathic involvement.

## 2022-04-07 IMAGING — MR MRI CERVICAL SPINE W/WO CONTRAST
7 of 11 series · 27 of 48 positions shown · IV contrast (Gadolinium)
Comparison: None.

Referring: DONALD, ROCHELL                
________________________________________________________________________________________________ 
MRI CERVICAL SPINE W/WO CONTRAST, 04/07/2022 [DATE]: 
CLINICAL INDICATION: Neck pain. History of melanoma and breast cancer.
TECHNIQUE: Multiplanar, multiecho position MR images of the cervical spine were 
performed without and with 10 mL of Gadavist were injected intravenously by 
hand. 0 mL of Gadavist were discarded. Patient was scanned on a 1.5T magnet.

[Series 1001: survey · axial · 10.0mm · 1.25mm/px · 1 of 10 slices shown]
[im 1/10]
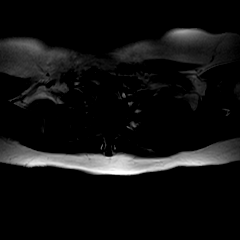

[Series 1101: t2w_cor-surv · coronal · 5.0mm · 0.69mm/px · 1 of 7 slices shown]
[im 1/7]
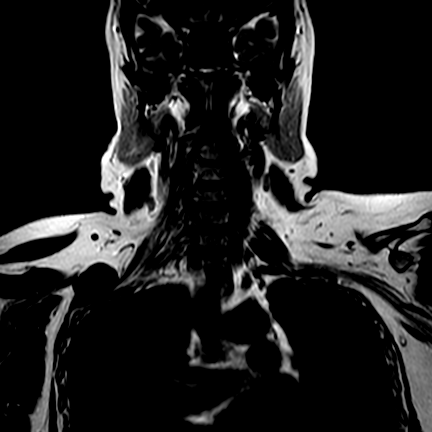

[Series 1201: t1_sag · sagittal · 3.0mm · 0.39mm/px · 4 of 17 slices shown]
[im 1/17]
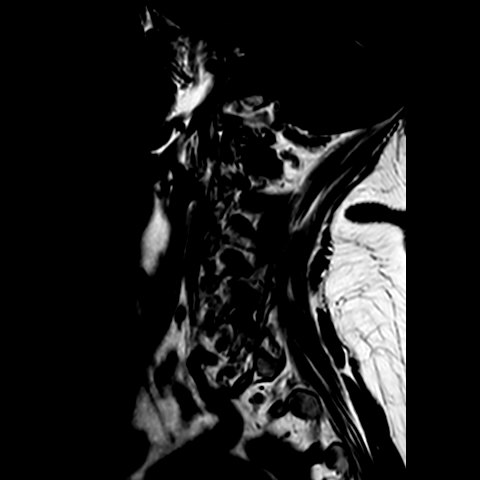
[im 6/17]
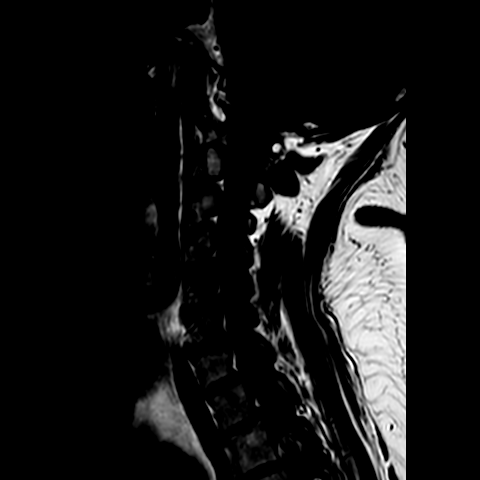
[im 11/17]
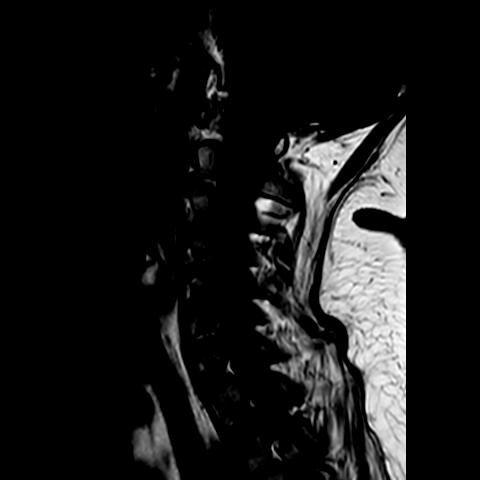
[im 17/17]
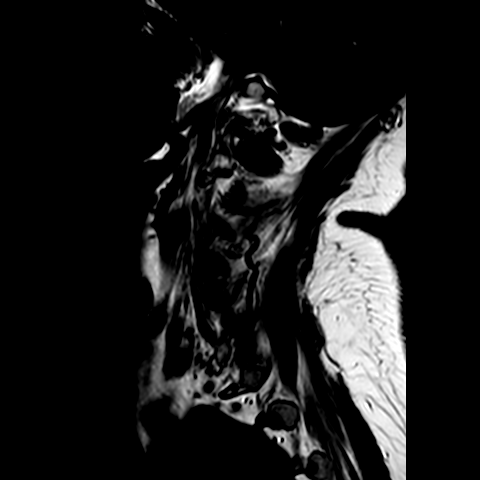

[Series 1302: (id)_mdixon_tse · sagittal · 3.0mm · 0.42mm/px · 4 of 18 slices shown]
[im 1/18]
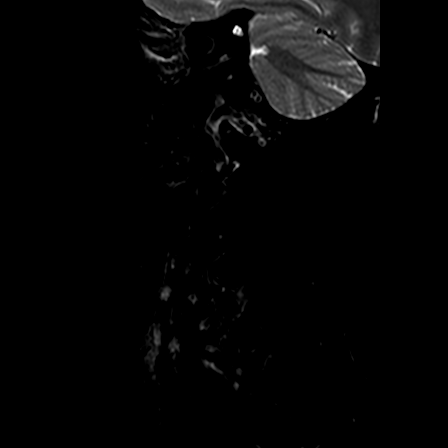
[im 6/18]
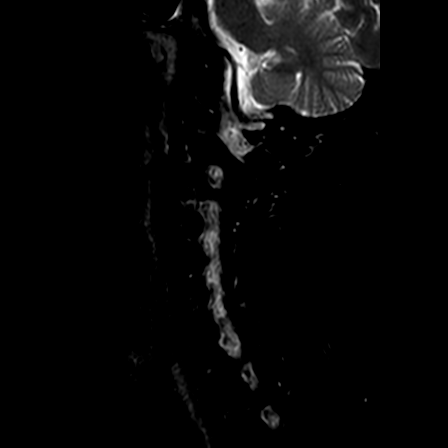
[im 12/18]
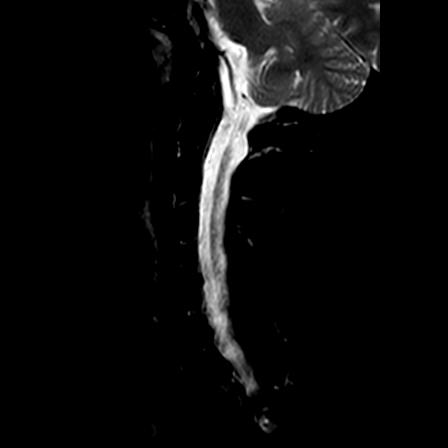
[im 18/18]
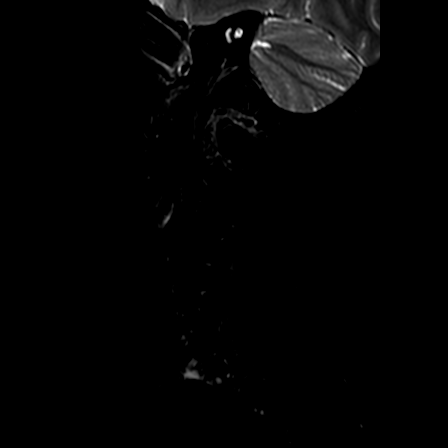

[Series 1303: st2w_mdixon_tse · sagittal · 3.0mm · 0.42mm/px · 3 of 18 slices shown]
[im 1/18]
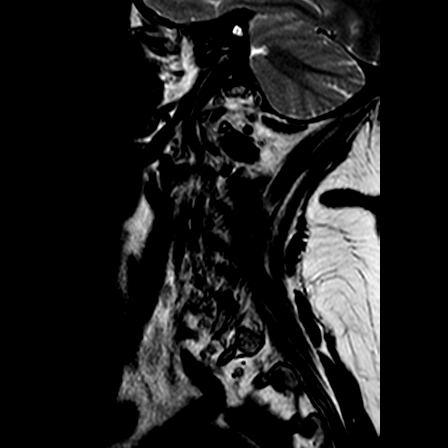
[im 6/18]
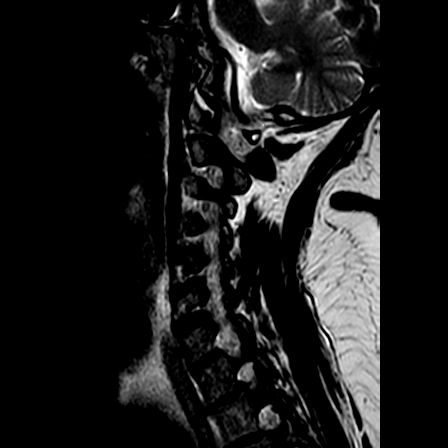
[im 12/18]
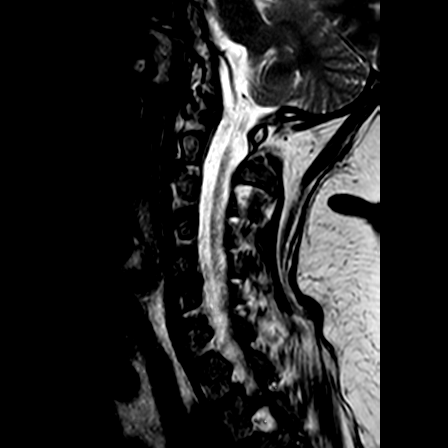

[Series 1501: T2 · axial · 3.0mm · 0.31mm/px · z∈[-176,-87]mm · 7 of 30 slices shown]
[im 1/30]
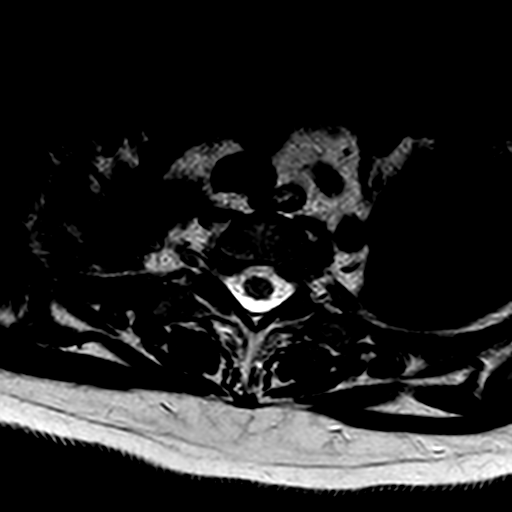
[im 5/30]
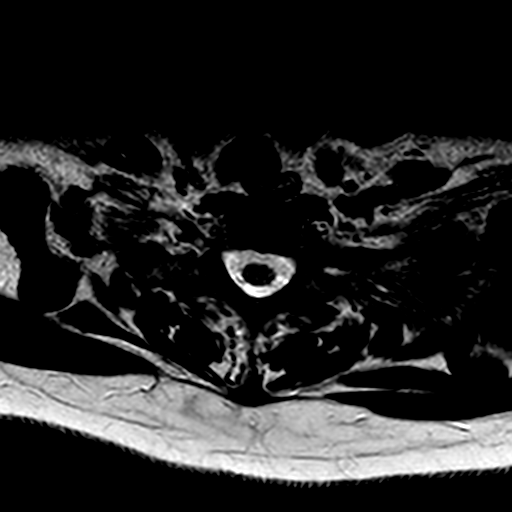
[im 10/30]
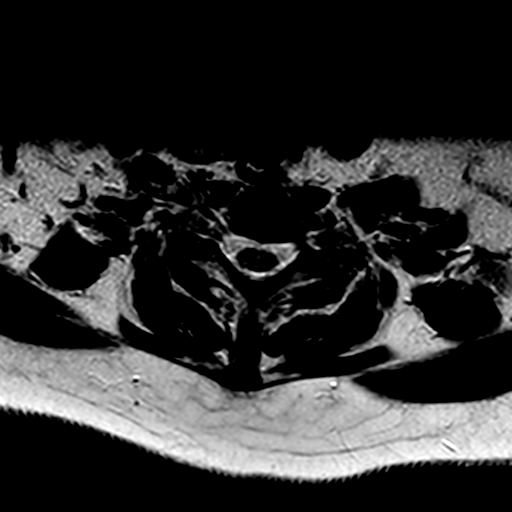
[im 15/30]
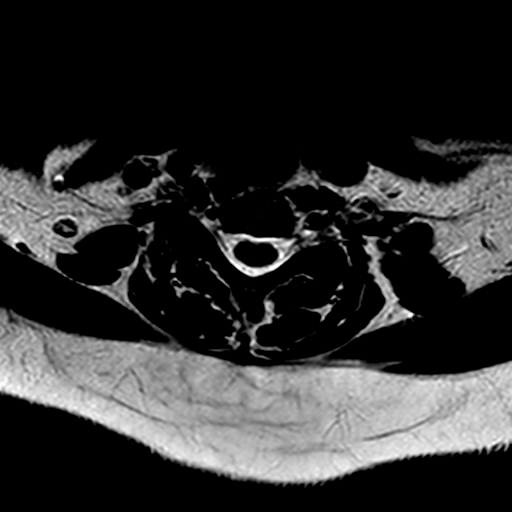
[im 20/30]
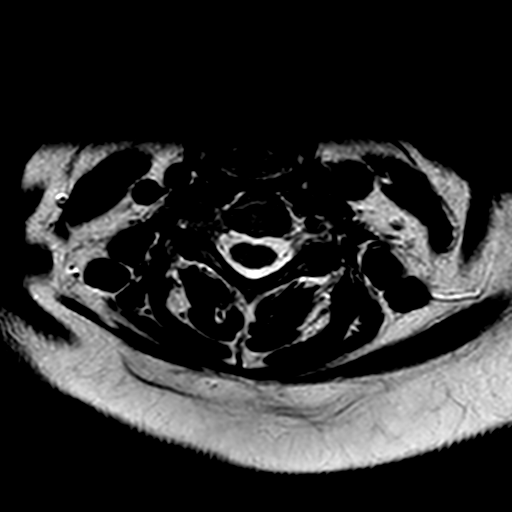
[im 25/30]
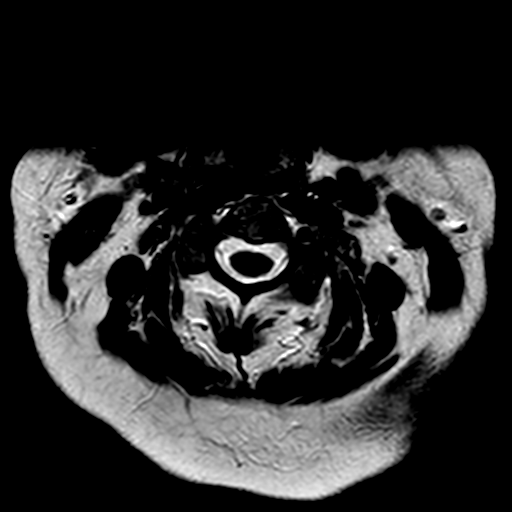
[im 30/30]
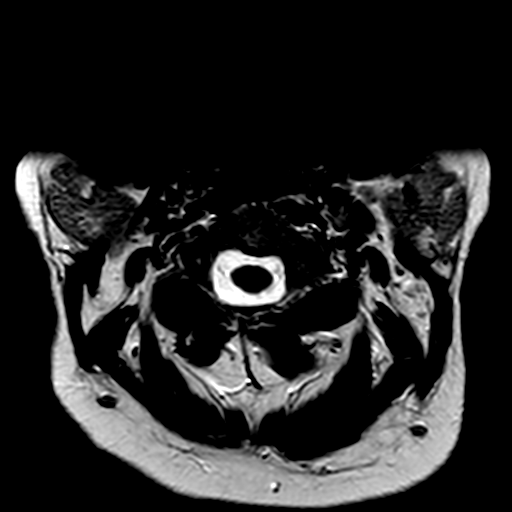

[Series 1601: T1 · axial · 3.0mm · 0.59mm/px · z∈[-174,-85]mm · 7 of 30 slices shown]
[im 1/30]
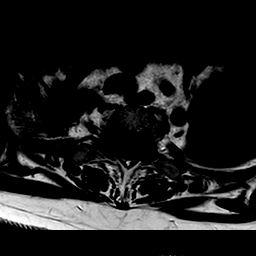
[im 5/30]
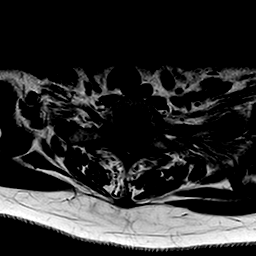
[im 10/30]
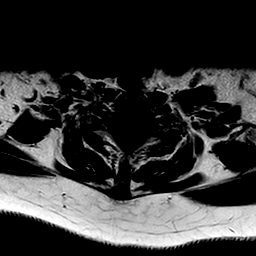
[im 15/30]
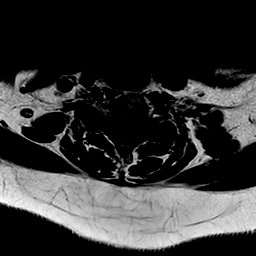
[im 20/30]
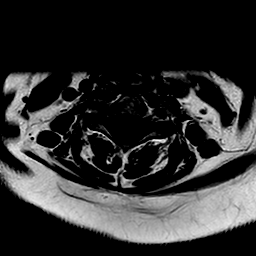
[im 25/30]
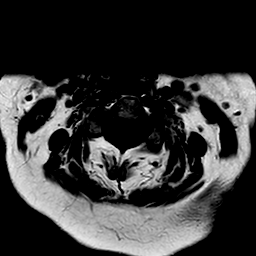
[im 30/30]
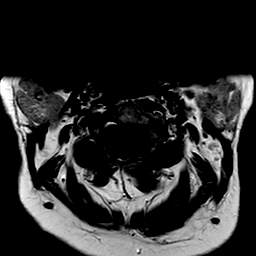

[27 of 48 positions shown; findings below may reference images not displayed]

FINDINGS: -------------------------------------------------------------------------------- 
----------------- 
GENERAL: 
ALIGNMENT: Normal. 
VERTEBRAL BODY HEIGHT: Normal.  
MARROW SIGNAL: No focal suspect signal abnormality. 
CORD SIGNAL: Normal.  
ADDITIONAL FINDINGS: None. 
-------------------------------------------------------------------------------- 
---------------- 
SEGMENTAL: 
CRANIOCERVICAL JUNCTION: No significant stenosis. 
C2-C3: Trace disc osteophyte complex eccentric to the left. No significant 
central canal or neural foraminal narrowing.  
C3-C4: No significant central canal or neural foraminal narrowing. 
C4-C5: No significant central canal or neural foraminal narrowing. 
C5-C6: Very mild disc osteophyte complex. No significant central canal or neural 
foraminal narrowing.  
C6-C7: Very mild disc osteophyte complex. No significant central canal or neural 
foraminal narrowing.  
C7-T1: Tiny central disc herniation. No significant central canal or neural 
foraminal narrowing.  
-------------------------------------------------------------------------------- 
---------------
IMPRESSION: No significant stenosis or pathologic enhancement in the cervical spine.

## 2023-01-21 IMAGING — MR MRI CERVICAL SPINE W/WO CONTRAST
7 of 11 series · 29 of 48 positions shown · IV contrast (gadavist)
Comparison: Brain MRI January 21, 2023. MRI cervical spine April 07, 2022.

Referring: ANABALON, VALESKA ANDREA                
________________________________________________________________________________________________ 
MRI CERVICAL SPINE W/WO CONTRAST, 01/21/2023 [DATE]: 
CLINICAL INDICATION: Autoimmune thyroiditis. Malignant neoplasm of thyroid gland 
, evaluate for metastases. Thyroid cancer. Melanoma and breast cancer.
TECHNIQUE: Multiplanar, multiecho position MR images of the cervical spine were 
performed without and with 10 mL of Gadavist injected intravenously by hand. 
Patient was scanned on a 1.5T magnet.

[Series 1001: survey · axial · 10.0mm · 1.25mm/px · z∈[-208,+10]mm · 2 of 10 slices shown]
[im 1/10]
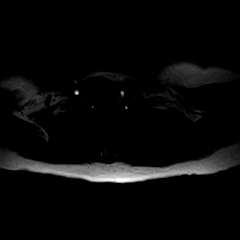
[im 10/10]
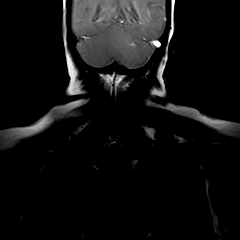

[Series 1101: t2w_cor-surv · coronal · 5.0mm · 0.69mm/px · 1 of 7 slices shown]
[im 1/7]
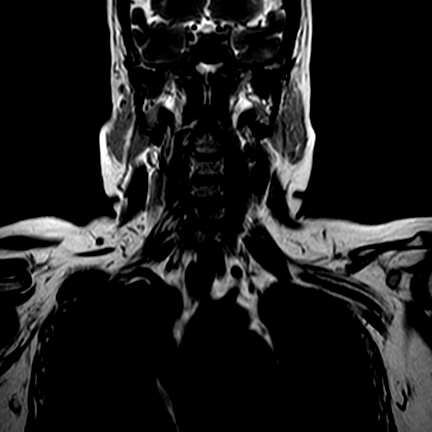

[Series 1201: t1_sag · sagittal · 3.0mm · 0.37mm/px · 4 of 19 slices shown]
[im 1/19]
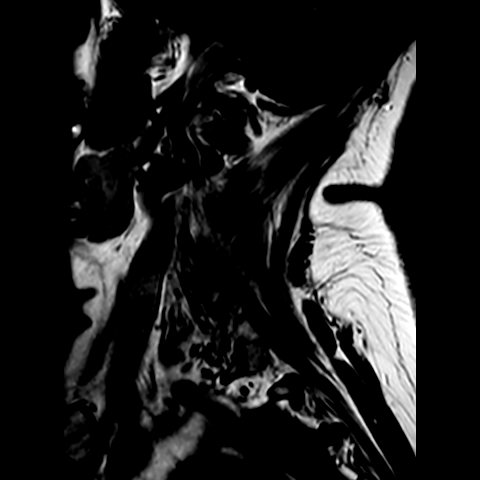
[im 7/19]
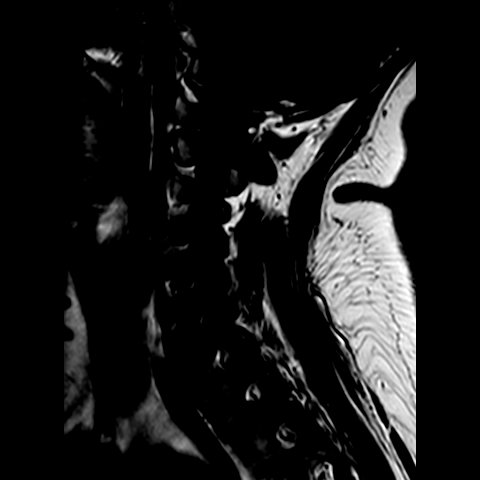
[im 13/19]
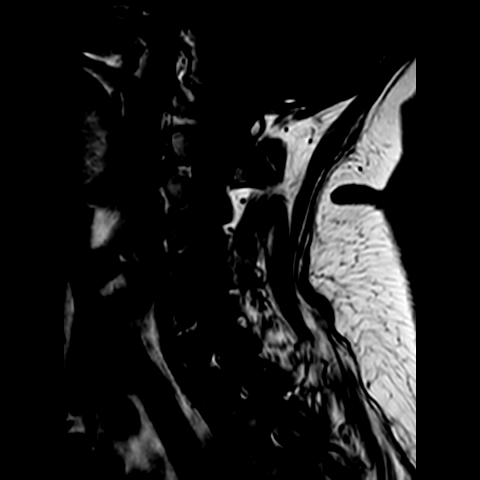
[im 19/19]
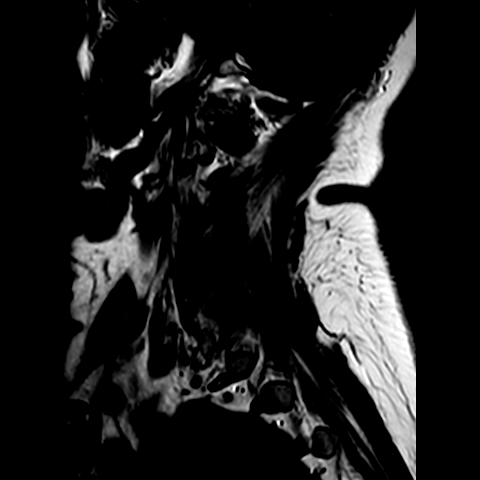

[Series 1302: (id)_mdixon_tse · sagittal · 3.0mm · 0.38mm/px · 3 of 19 slices shown]
[im 1/19]
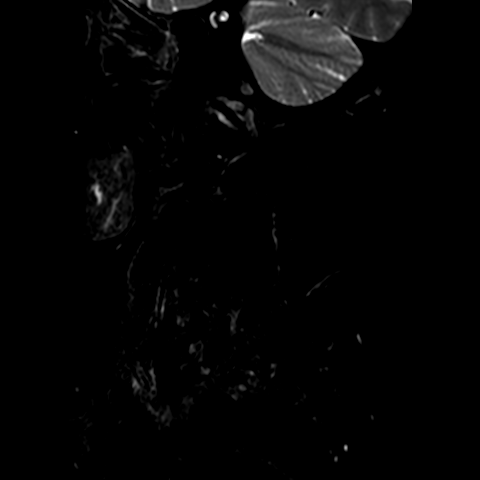
[im 7/19]
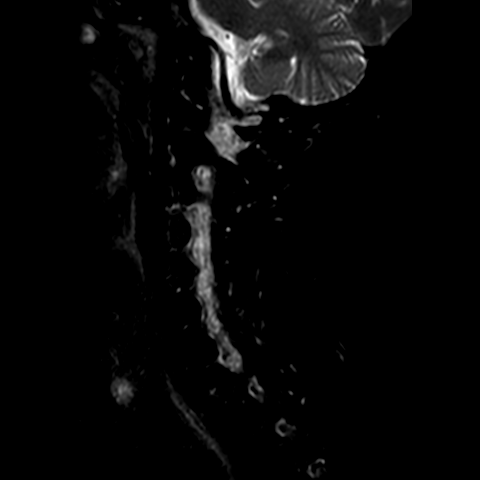
[im 13/19]
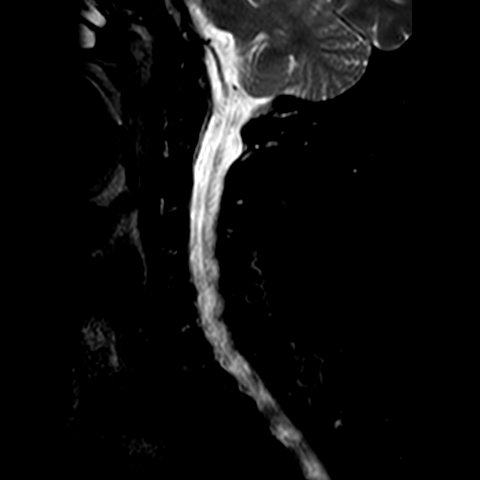

[Series 1501: T2 · axial · 3.0mm · 0.31mm/px · z∈[-217,-120]mm · 7 of 32 slices shown]
[im 1/32]
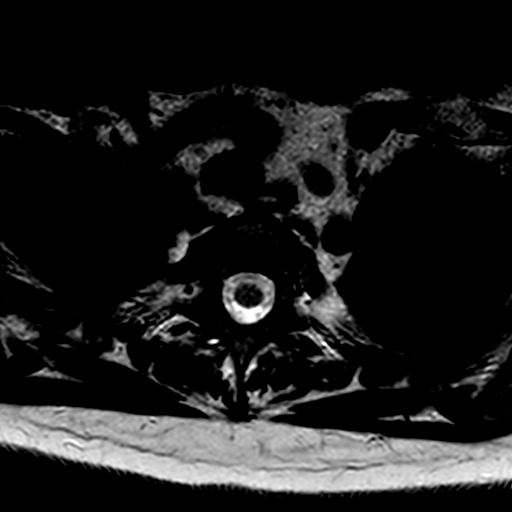
[im 6/32]
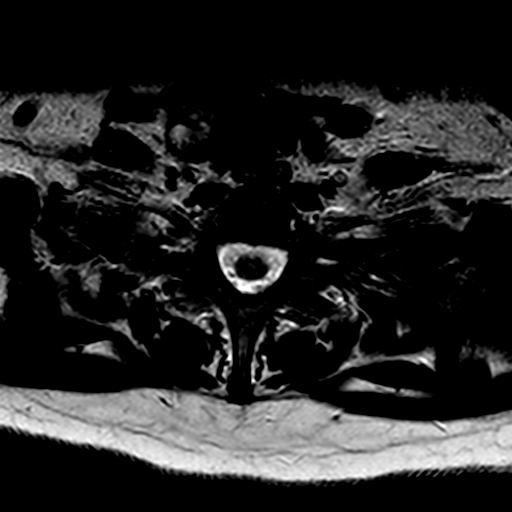
[im 11/32]
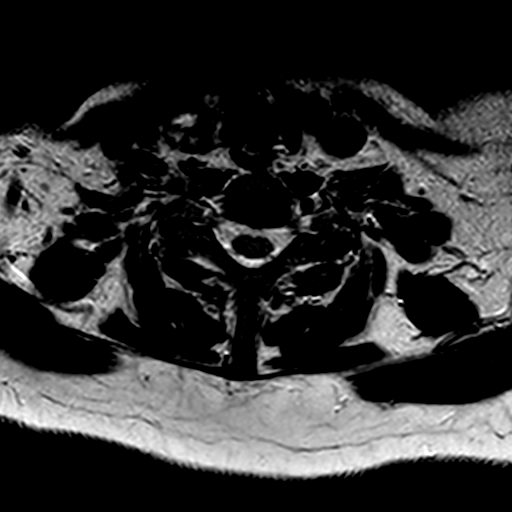
[im 16/32]
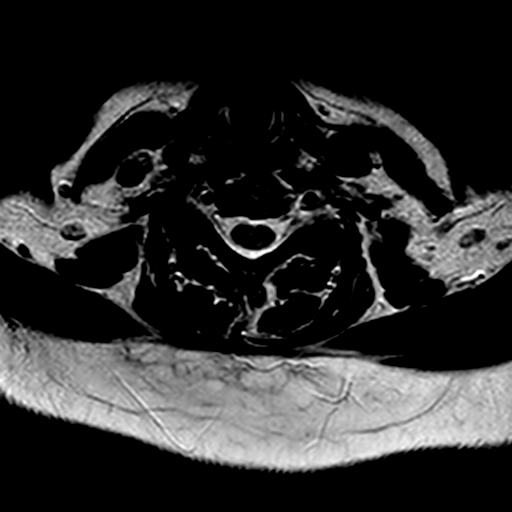
[im 21/32]
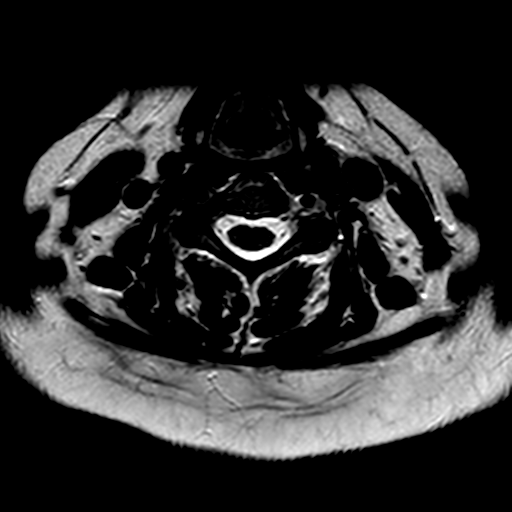
[im 26/32]
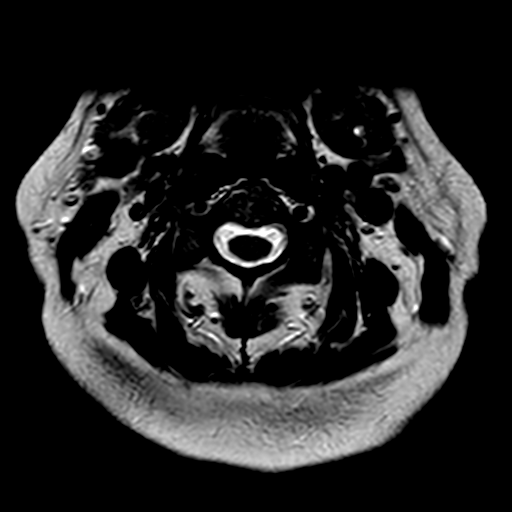
[im 32/32]
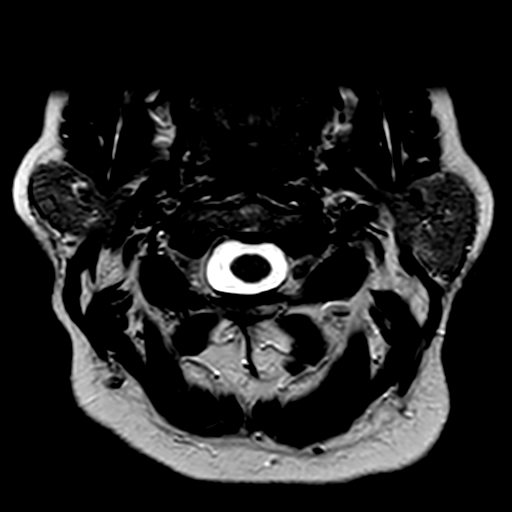

[Series 1601: T1 · axial · 3.0mm · 0.59mm/px · z∈[-213,-122]mm · 6 of 30 slices shown]
[im 1/30]
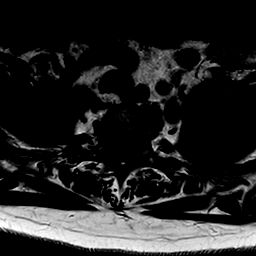
[im 6/30]
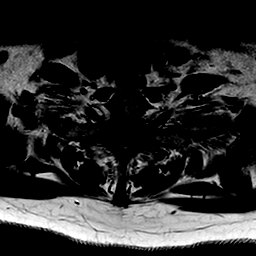
[im 12/30]
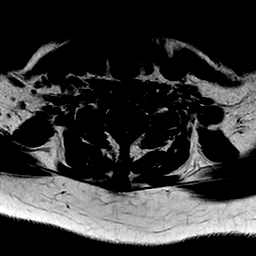
[im 18/30]
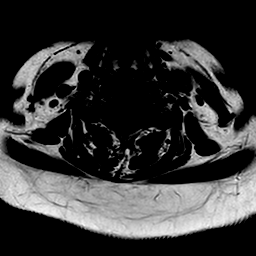
[im 24/30]
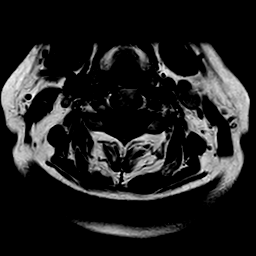
[im 30/30]
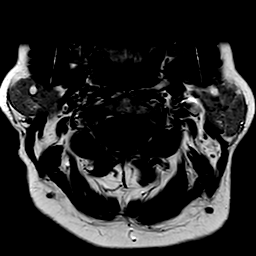

[Series 1801: T1 post-contrast · axial · 3.0mm · 0.59mm/px · z∈[-213,-122]mm · 6 of 30 slices shown]
[im 1/30]
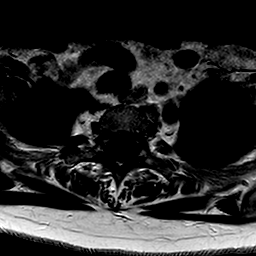
[im 6/30]
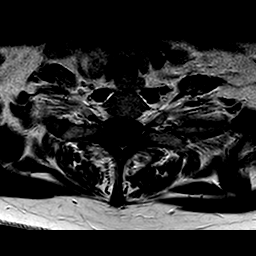
[im 12/30]
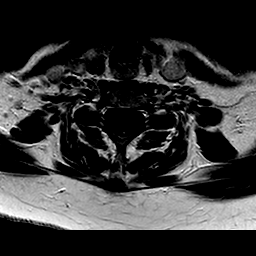
[im 18/30]
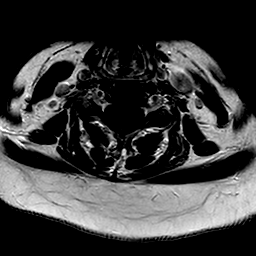
[im 24/30]
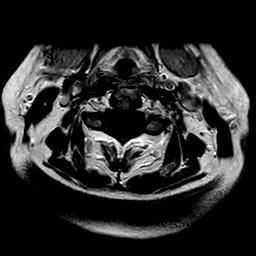
[im 30/30]
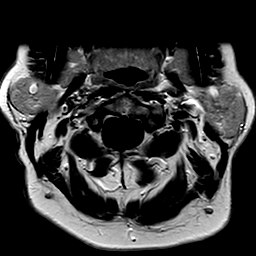

[29 of 48 positions shown; findings below may reference images not displayed]

FINDINGS: -------------------------------------------------------------------------------- 
----------------- 
GENERAL: 
ALIGNMENT: Normal coronal alignment. Normal sagittal alignment. 
VERTEBRAL BODY HEIGHT: Normal.  
MARROW SIGNAL: No focal suspect signal abnormality. 
CORD SIGNAL: Normal.  No pathologic enhancement. 
ADDITIONAL FINDINGS: Heterogeneous masslike enlargement of the right thyroid 
lobe.. 
-------------------------------------------------------------------------------- 
---------------- 
SEGMENTAL:  
CRANIOCERVICAL JUNCTION: No significant stenosis. 
C2-C3: Normal disc height. No herniation. Normal facets. No spinal canal or 
neural foraminal stenosis. 
C3-C4: Normal disc height. No herniation. Normal facets. No spinal canal or 
neural foraminal stenosis. 
C4-C5: Normal disc height. No herniation. Normal facets. No spinal canal or 
neural foraminal stenosis. 
C5-C6: Normal disc height. Minimal disc osteophyte complex. Slight right-sided 
uncinate spurring. Right-sided facet arthropathy. Canal and foramina are patent. 
Stable. 
C6-C7: Normal disc height. Minimal disc ossify complex. Canal and foramina are 
patent with normal facets. Stable. 
C7-T1: Normal disc height. No herniation. Normal facets. No spinal canal or 
neural foraminal stenosis. 
Visualized upper thoracic segments are unremarkable. 
-------------------------------------------------------------------------------- 
---------------
IMPRESSION: No evidence of metastatic disease within the cervical spine. Stable mild 
cervical spondylosis. 
Heterogeneous masslike enlargement of the right thyroid lobe. Correlate with 
thyroid ultrasound June 04, 2022.

## 2023-01-21 IMAGING — MR MRI BRAIN W/WO CONTRAST
2 of 17 series · 5 of 48 positions shown · IV contrast (gadavist)
Comparison: MRI brain from April 07, 2022.

Referring: KOON, KASEY                
________________________________________________________________________________________________ 
MRI BRAIN W/WO CONTRAST, 01/21/2023 [DATE]: 
CLINICAL INDICATION: Thyroid cancer. Melanoma. Breast cancer. Assess for 
metastatic disease.
TECHNIQUE: Multiplanar, multiecho position MR images of the brain were performed 
without and with 10 mL of Gadavist were injected intravenously by hand. Patient 
was scanned on a 1.5T magnet.

[Series 1902: T1 post-contrast · coronal · 1.0mm · 0.24mm/px · 3 of 180 slices shown (1 of 2)]
[im 26/180]
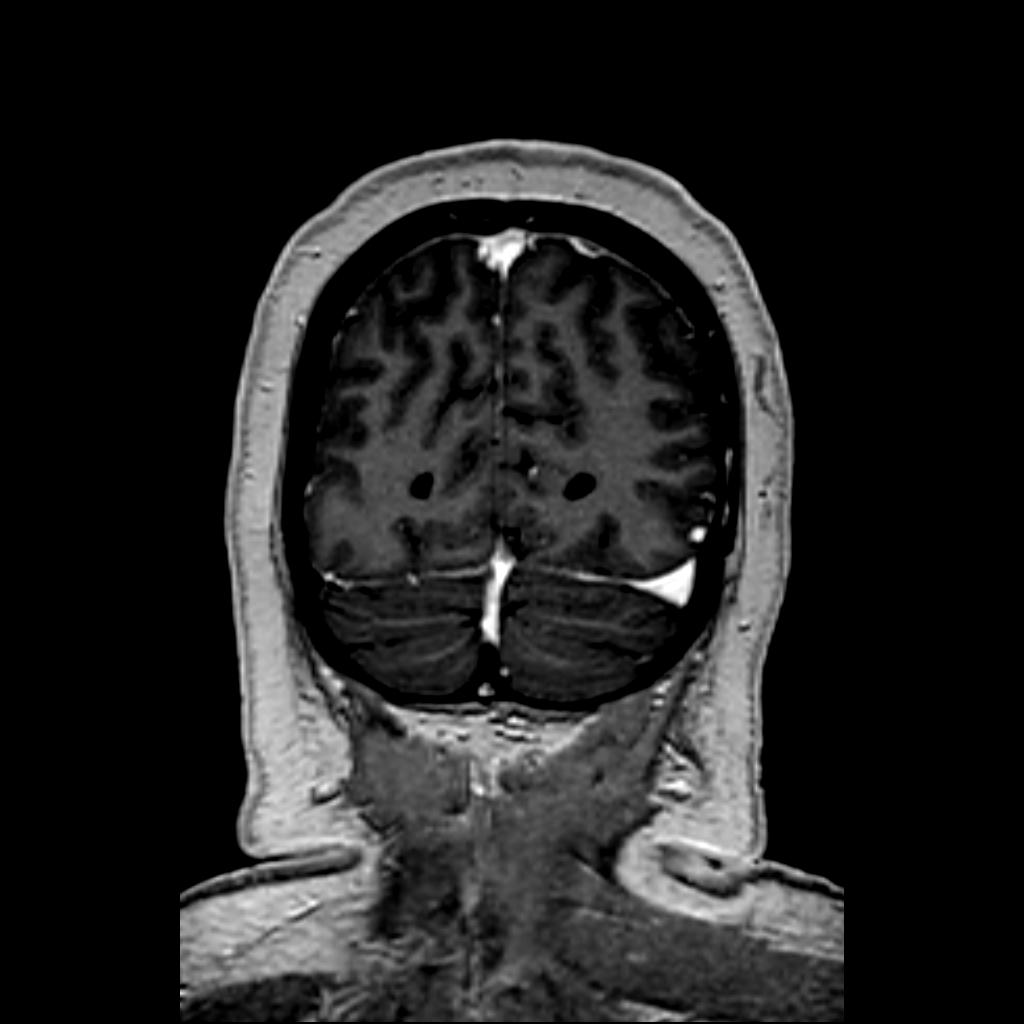
[im 103/180]
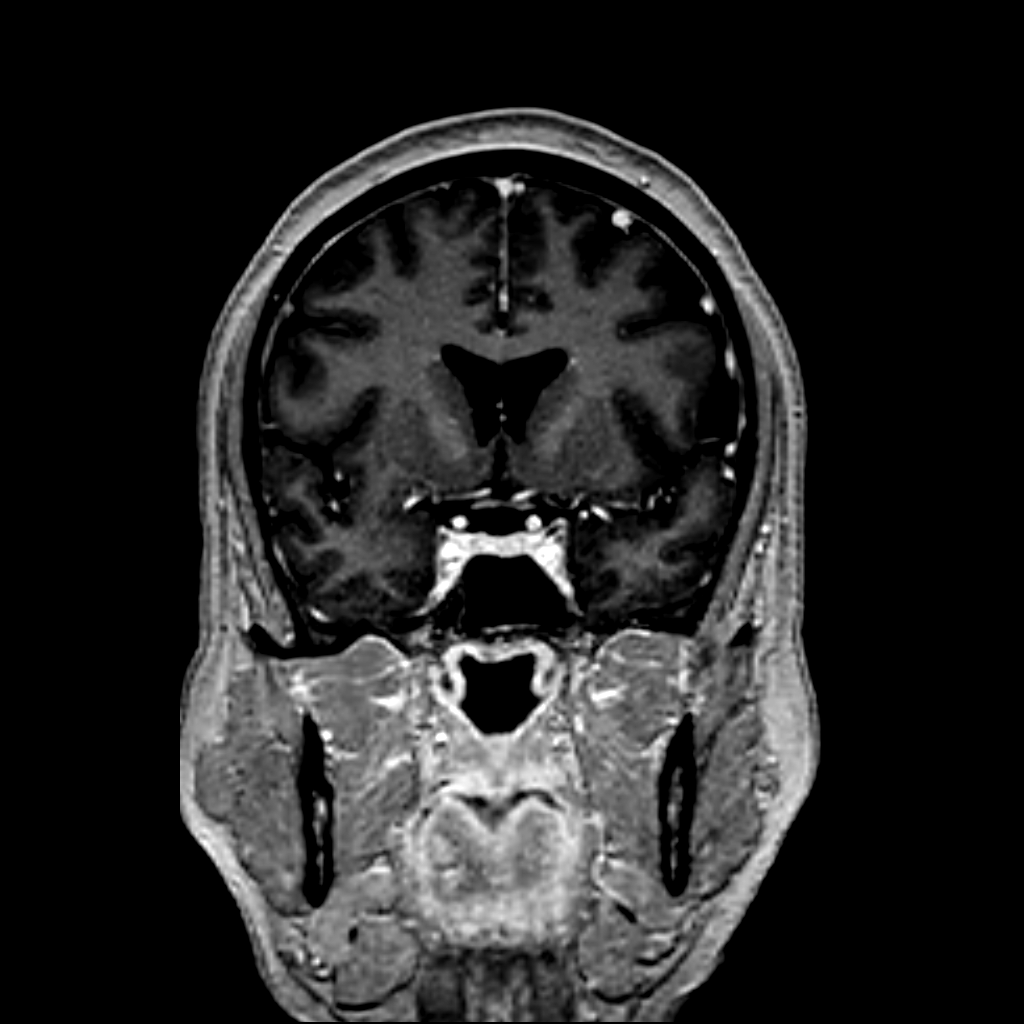
[im 154/180]
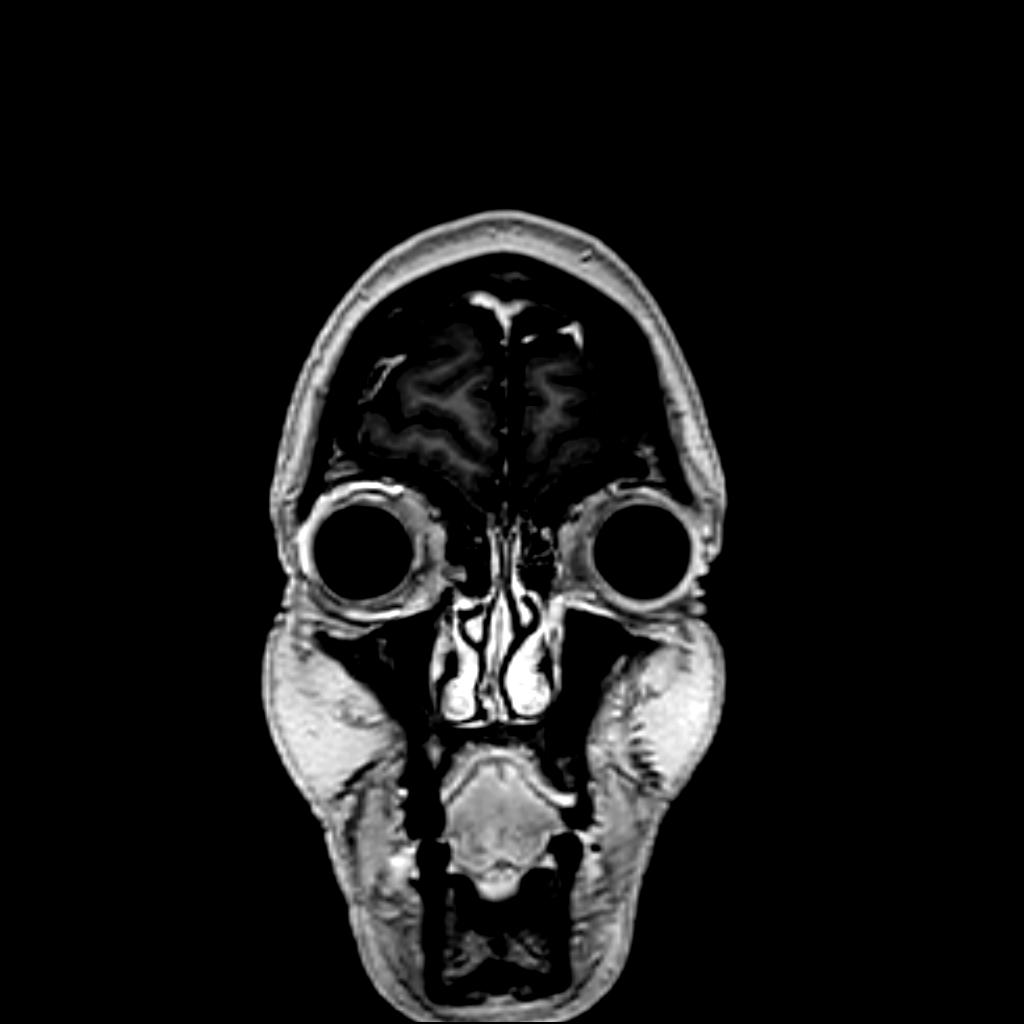

[Series 1903: T1 post-contrast · axial · 1.0mm · 0.24mm/px · z∈[-65,-9]mm · 2 of 170 slices shown (2 of 2)]
[im 29/170]
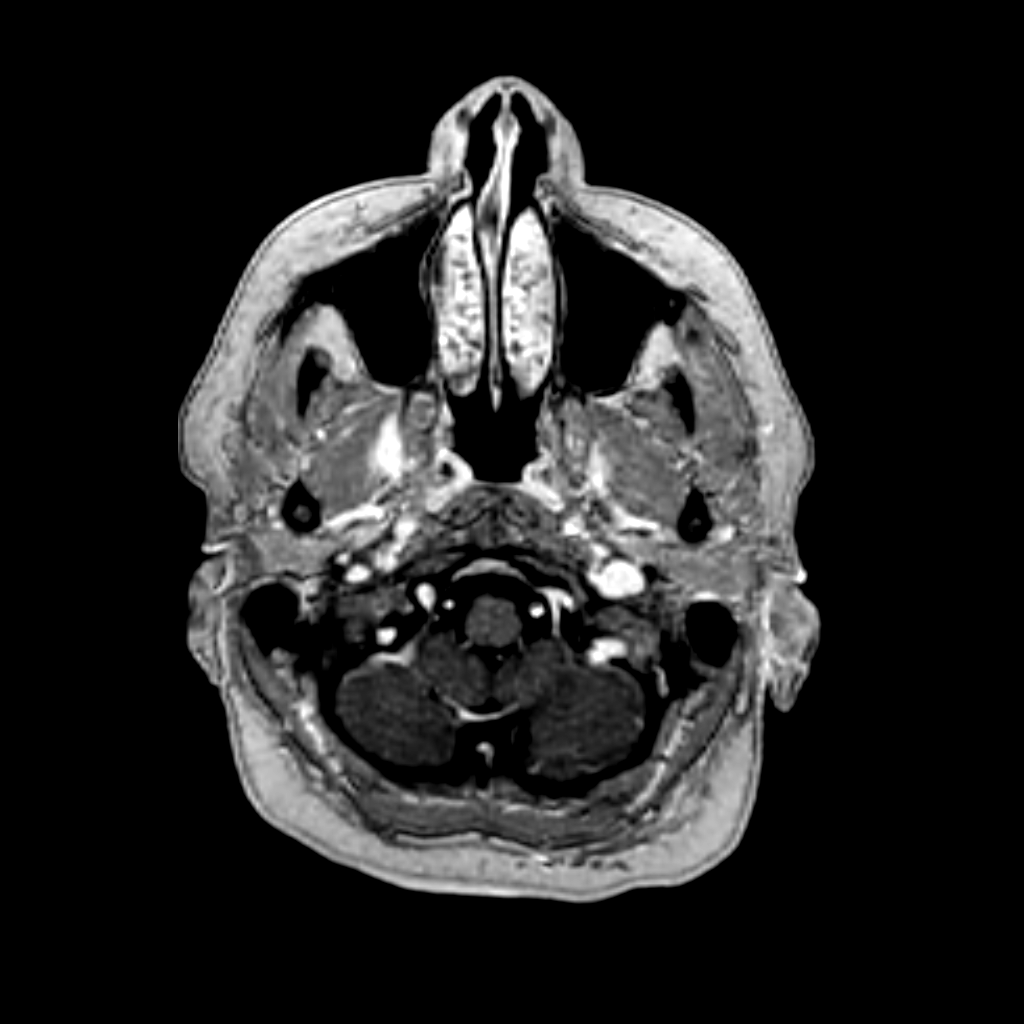
[im 85/170]
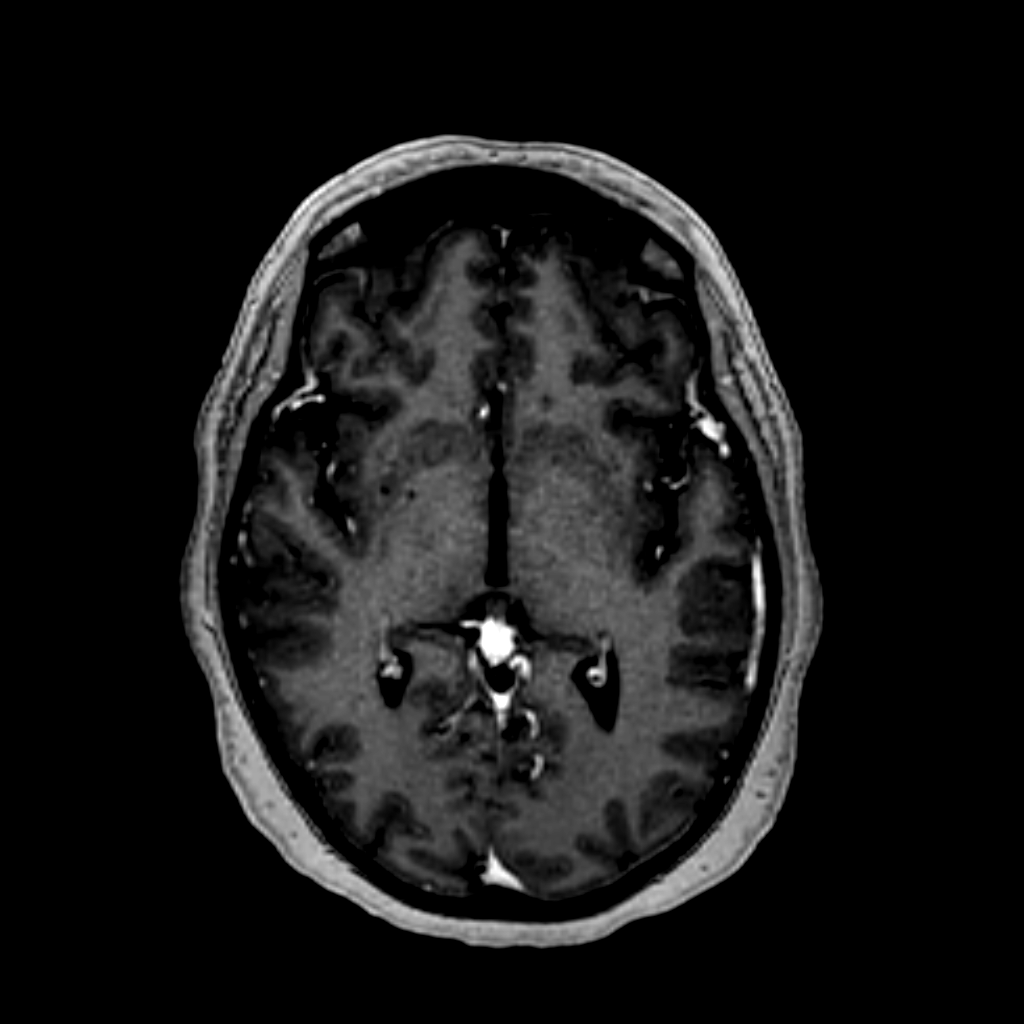

[5 of 48 positions shown; findings below may reference images not displayed]

FINDINGS: -------------------------------------------------------------------------------- 
------------------------- 
INTRACRANIAL: 
No acute ischemia. No abnormal foci of susceptibility artifact in the brain. 
Patency of intracranial vascular flow voids. Punctate foci of T2 prolongation in 
the bilateral frontal deep white matter from white matter microangiopathic 
change. No acute hemorrhage, midline shift. Left lateral frontal convexity 
extra-axial mass measuring 1.3 x 0.8 x 0.9 cm consistent with meningioma. There 
is mild mass effect upon the left lateral frontal lobe without edema in the 
white matter. No pathologic enhancement within the brain. 
-------------------------------------------------------------------------------- 
----------------------- 
OTHER: 
ORBITS/SINUSES/T-BONES:  Visualized orbits show no acute abnormality or mass.  
Mastoid air cells and middle ear cavities are grossly clear.  Visualized 
paranasal sinuses are clear. 
MARROW SIGNAL/SOFT TISSUES: No focal suspect signal abnormality.  
-------------------------------------------------------------------------------- 
-------------------
IMPRESSION: 1.  No metastatic disease in the brain. 
2.  Stable small meningioma along the left frontal convexity. 
3.  Stable mild white matter microangiopathic change.

## 2023-01-23 IMAGING — MR MRI LUMBAR SPINE W/WO CONTRAST
6 of 12 series · 12 of 48 positions shown · IV contrast (Gadolinium)
Comparison: MRI thoracic spine January 23, 2023. MRI lumbar spine April 04, 2022.

Referring: JIM, KIRI                
________________________________________________________________________________________________ 
MRI LUMBAR SPINE W/WO CONTRAST, 01/23/2023 [DATE]: 
CLINICAL INDICATION: Autoimmune thyroiditis. Malignant neoplasm of thyroid gland 
, evaluate for metastatic disease. Melanoma and breast cancer. Pain.
TECHNIQUE: Multiplanar, multiecho position MR images of the lumbar spine were 
performed without and with 10 mL of Gadavist were injected intravenously by 
hand. Patient was scanned on a 1.5T magnet

[Series 901: survey · axial · 10.0mm · 1.25mm/px · z∈[-155,+82]mm · 2 of 10 slices shown]
[im 1/10]
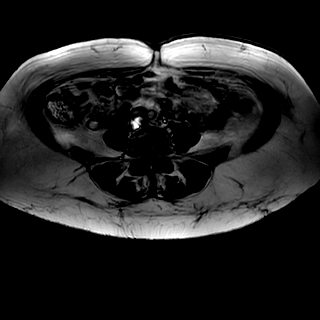
[im 10/10]
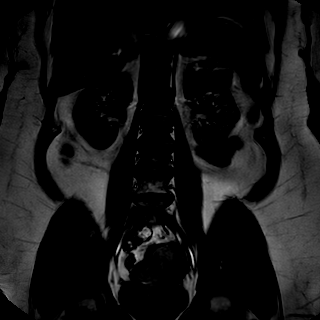

[Series 1001: t2w_cor-surv · coronal · 6.0mm · 0.62mm/px · 1 of 10 slices shown]
[im 1/10]
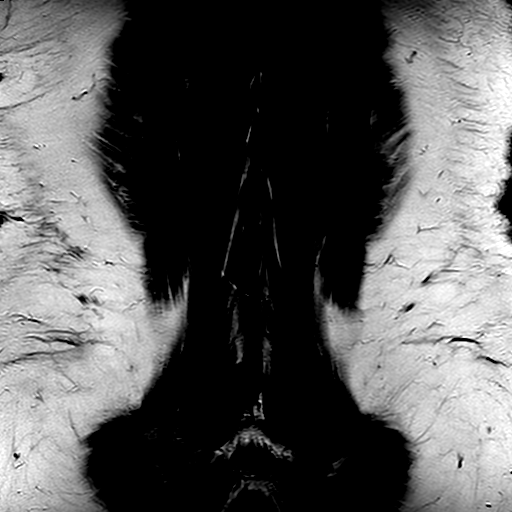

[Series 1101: t1_tse_sag · sagittal · 4.0mm · 0.44mm/px · 2 of 19 slices shown]
[im 1/19]
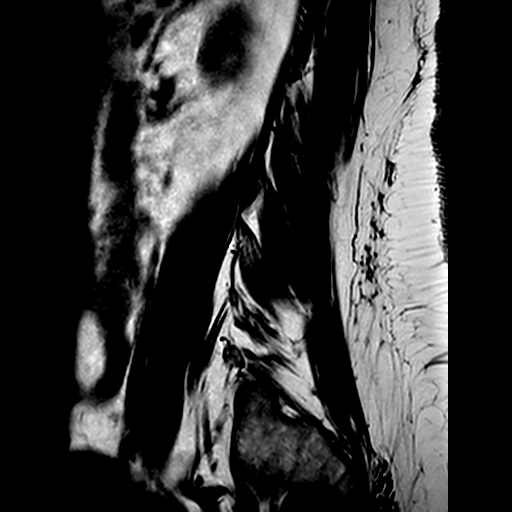
[im 19/19]
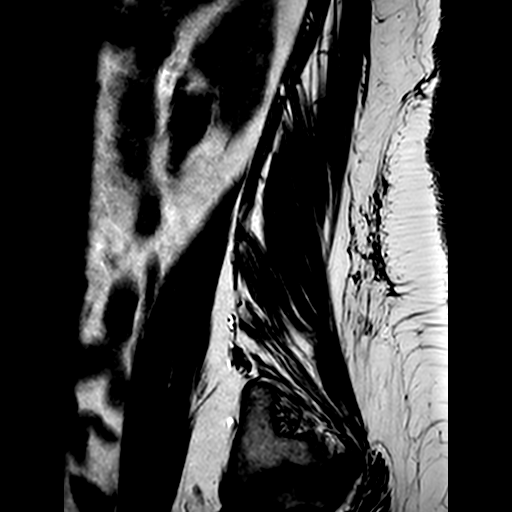

[Series 1202: (id)_mdixon_tse · sagittal · 4.0mm · 0.53mm/px · 2 of 19 slices shown]
[im 1/19]
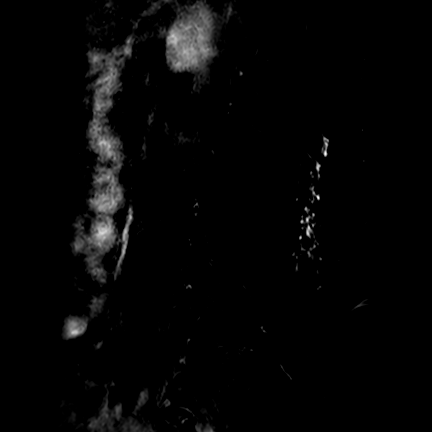
[im 19/19]
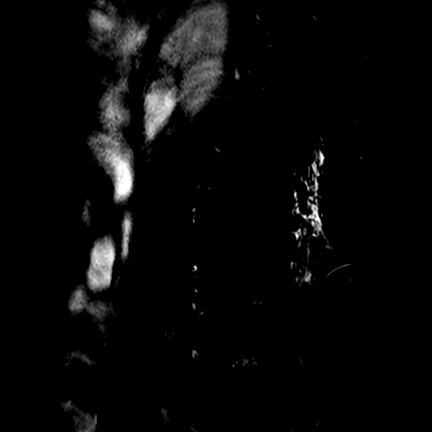

[Series 1203: st2w_mdixon_tse · sagittal · 4.0mm · 0.53mm/px · 2 of 19 slices shown]
[im 1/19]
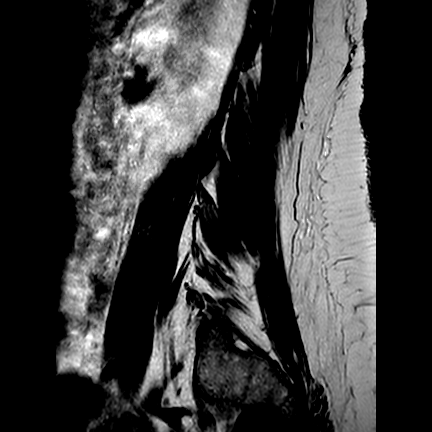
[im 19/19]
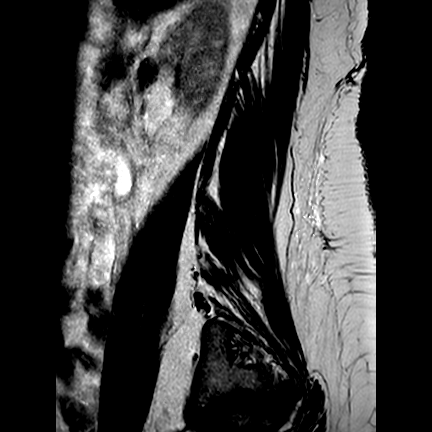

[Series 1302: (id) view_ax mpr · axial · 1.0mm · 0.25mm/px · z∈[-214,-133]mm · 3 of 114 slices shown]
[im 1/114]
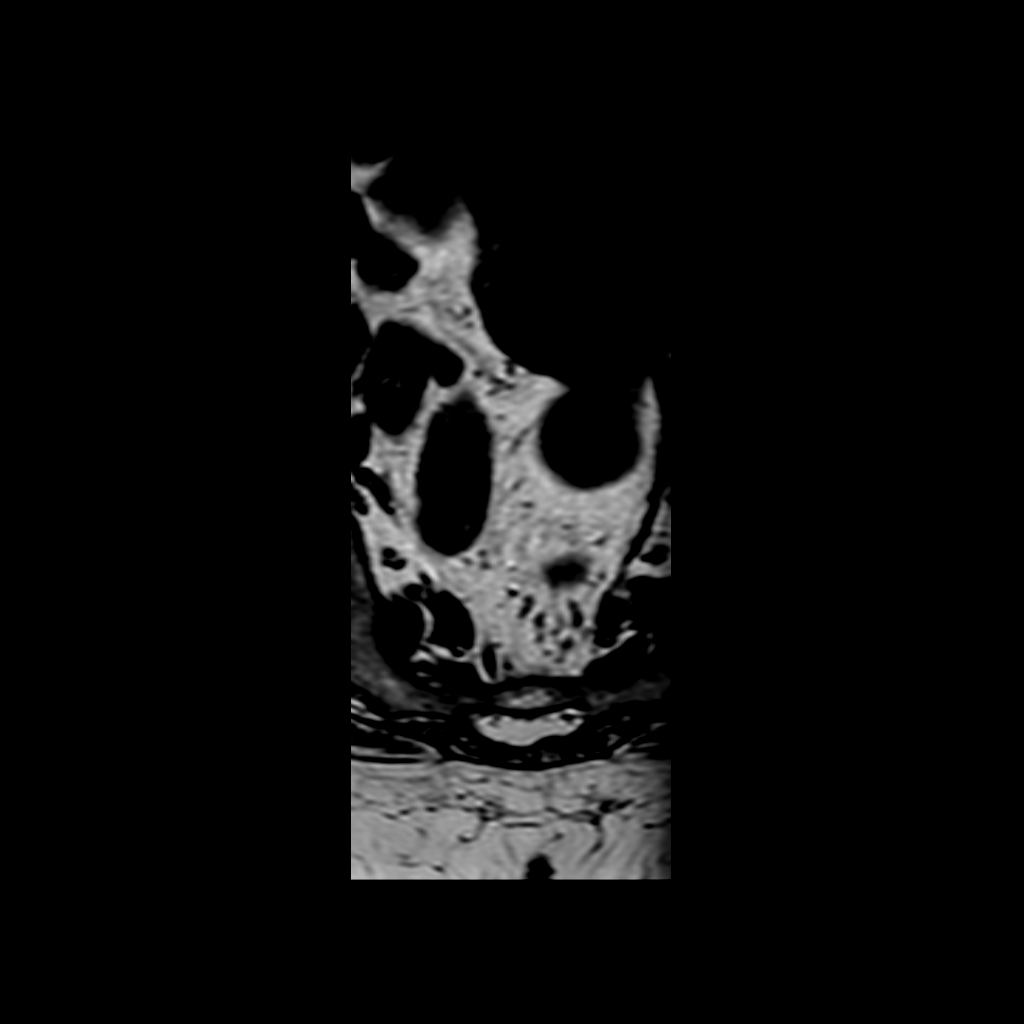
[im 19/114]
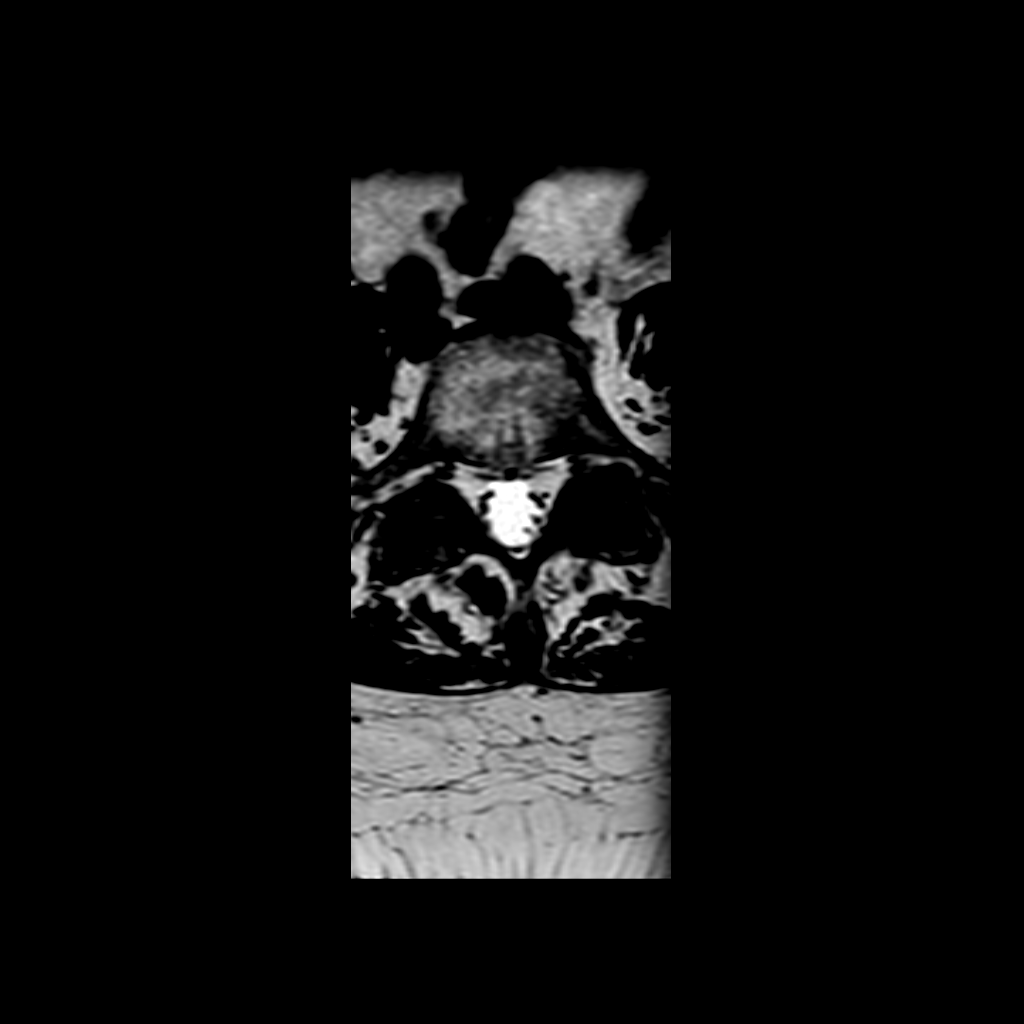
[im 38/114]
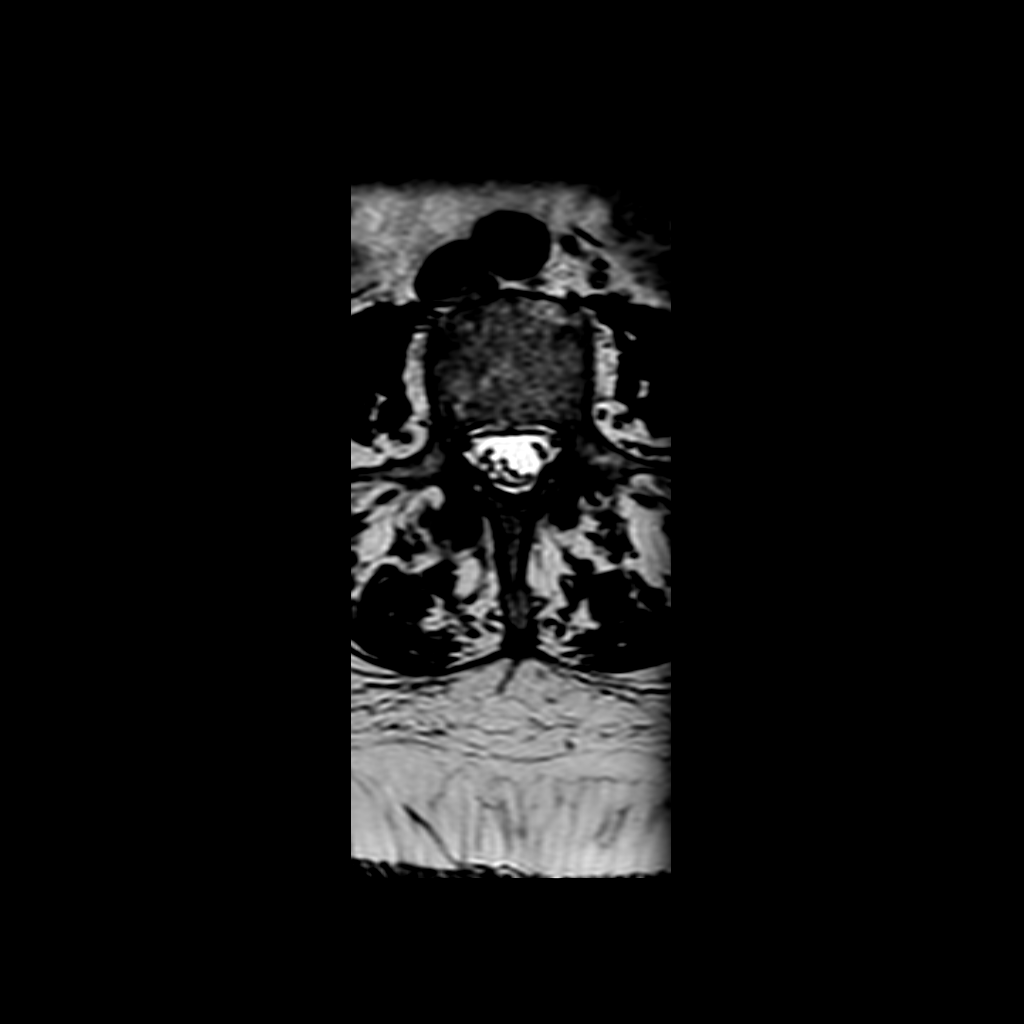

[12 of 48 positions shown; findings below may reference images not displayed]

FINDINGS: -------------------------------------------------------------------------------- 
------ 
GENERAL: 
Nomenclature is based on 5 lumbar type vertebral bodies.     
ALIGNMENT: Normal coronal alignment. Trace grade 1 retrolisthesis L2 on L3 and 
L3 on L4 and L4 on L5. Stable. 
VERTEBRAL BODY HEIGHT: Normal.  
MARROW SIGNAL: Stable 7 mm enhancing lesion within the L1 vertebral body. 
CORD SIGNAL: Normal distal spinal cord and cauda equina. Conus medullaris 
terminates at L1. 
ADDITIONAL FINDINGS: Uterus is enlarged by numerous hypointense masses, likely 
from myomatous change.. 
Modic I-II: L2-L3, L4-L5, L5-S1 
Ligamentum Flavum > 2.5 mm: All levels. 
-------------------------------------------------------------------------------- 
------ 
SEGMENTAL: 
T12-L1: Normal disc height and signal. No herniation. Normal facets. No spinal 
canal or neural foraminal stenosis. 
L1-L2: Normal disc height and signal. No herniation. Normal facets. No spinal 
canal or neural foraminal stenosis. 
L2-L3: Slight loss of disc height with loss of disc signal. Minimal annular 
bulge. Small bilateral facet joint effusions. Canal and foramina are patent. 
Stable. 
L3-L4: Loss of disc signal. Minimal annular bulge. Canal patent. Mild right 
foraminal narrowing. Left foramen patent. Mild facet arthropathy. Stable. 
L4-L5: Loss of disc height and signal with degenerative endplate irregularity. 
Canal patent. Facet arthropathy. Patent right foramen. Left foramen is mildly 
narrowed. Stable. 
L5-S1: Loss of disc signal. Minimal annular bulge. Canal and foramina are patent 
with facet arthropathy. Stable. 
-------------------------------------------------------------------------------- 
------
IMPRESSION: Stable 7 mm enhancing lesion L1 vertebral body. While stability since April 04, 2022 is reassuring, again could consider dedicated CT lumbar spine and 
correlate with possible bone scan. 
Stable lumbar degenerative changes. 
Enlarged uterus, possibly from numerous myomas. Again, pelvic ultrasound 
recommended.

## 2023-01-23 IMAGING — MR MRI THORACIC SPINE W/WO CONTRAST
5 of 13 series · 20 of 48 positions shown · IV contrast (gadolinium)
Comparison: MRI lumbar spine January 23, 2023, MRI cervical spine January 21, 2023. MRI cervical spine April 07, 2022 and MRI thoracic and lumbar spine

Referring: LENG, KARLTON                
________________________________________________________________________________________________ 
******** ADDENDUM #1 ********/n 
Addendum: Nonspecific regions of prominent signal intensity in the right lung. 
Follow-up chest CT recommended. 
MRI THORACIC SPINE W/WO CONTRAST, 01/23/2023 [DATE]: 
CLINICAL INDICATION: Autoimmune thyroiditis. Malignant neoplasm of thyroid gland 
, evaluate for metastatic disease. Thyroid cancer. Pain. History of melanoma and 
breast cancer.
TECHNIQUE: Multiplanar, multiecho position MR images of the thoracic spine were 
performed without and with intravenous gadolinium enhancement. 10 mL of 
Gadavistwere injected intravenously by hand. Patient was scanned on a
magnet.

[Series 202: T2 · sagittal · 4.0mm · 0.62mm/px · 1 of 11 slices shown (1 of 2)]
[im 1/11]
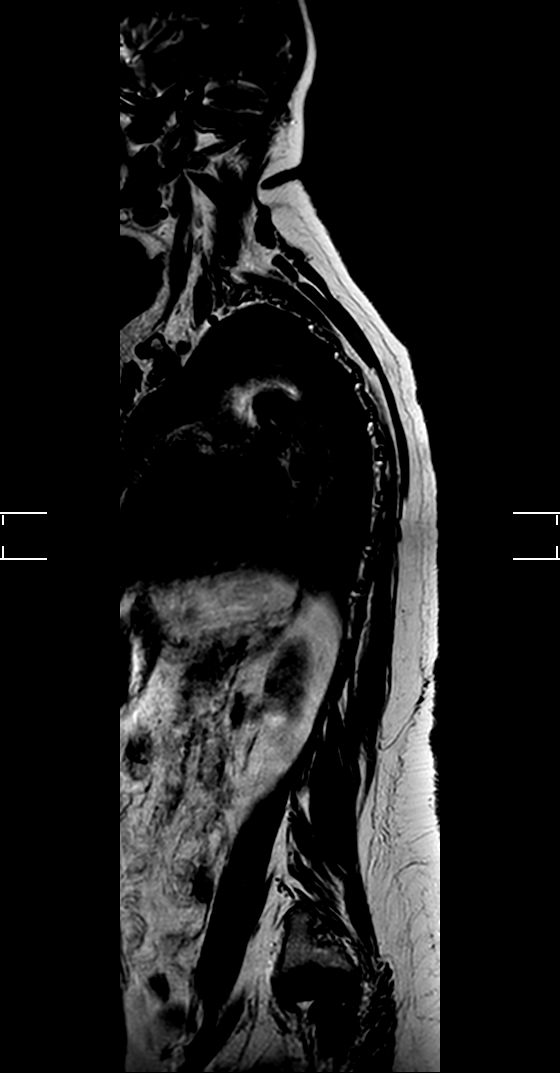

[Series 401: T1 · sagittal · 3.0mm · 0.56mm/px · 2 of 21 slices shown (1 of 2)]
[im 1/21]
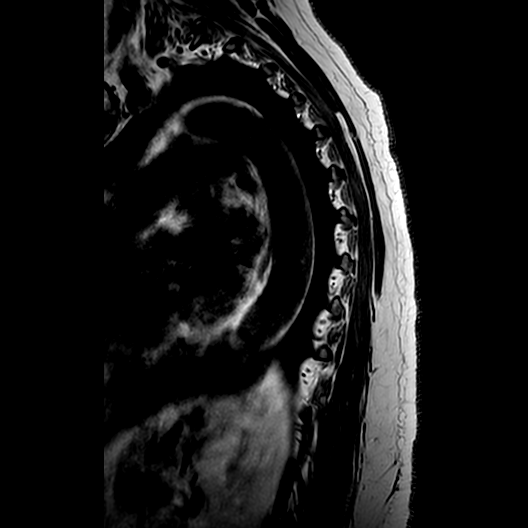
[im 21/21]
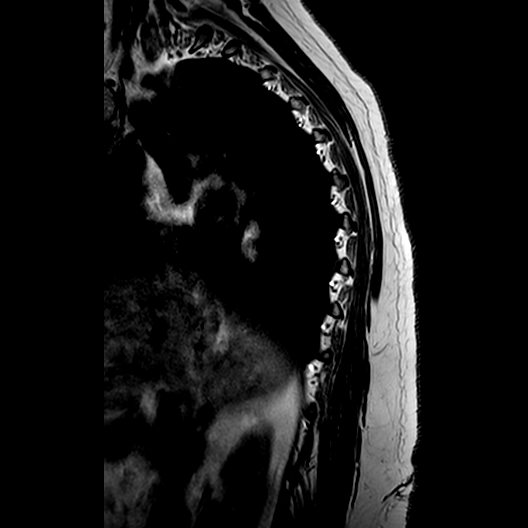

[Series 701: T2 · axial · 4.0mm · 0.42mm/px · z∈[-30,+203]mm · 6 of 71 slices shown (2 of 2)]
[im 1/71]
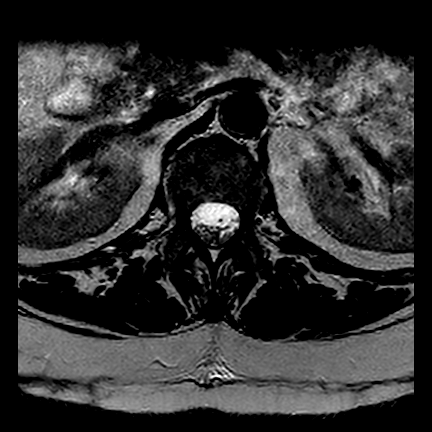
[im 15/71]
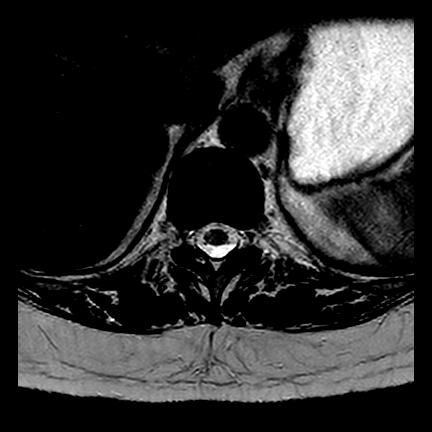
[im 29/71]
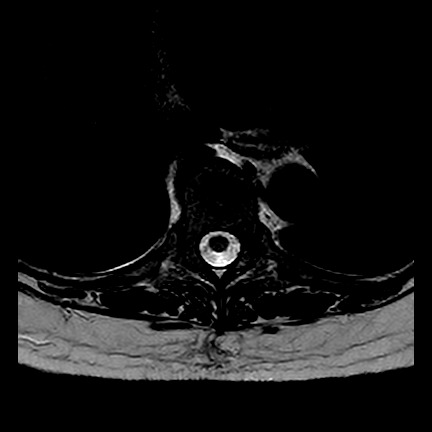
[im 43/71]
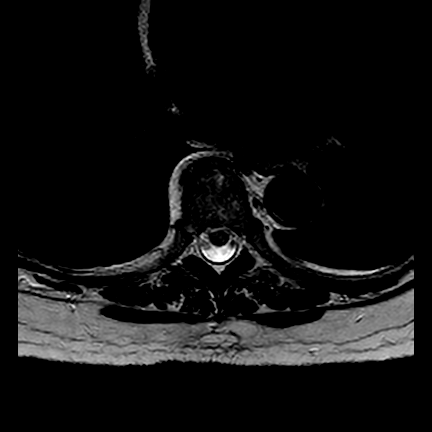
[im 57/71]
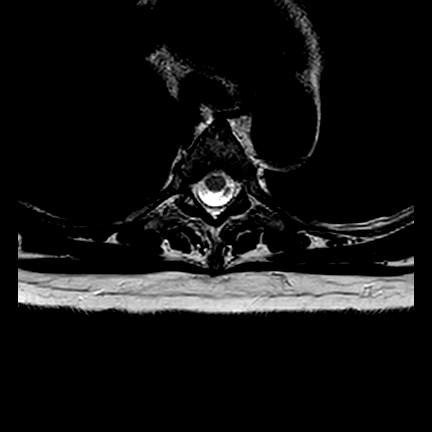
[im 71/71]
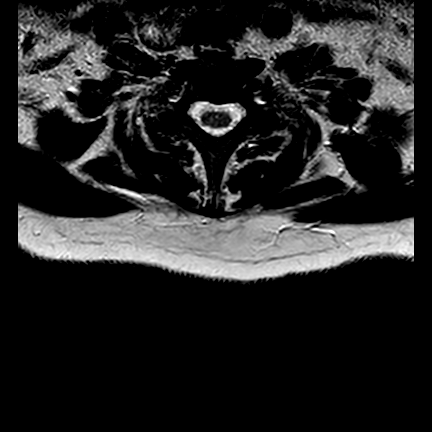

[Series 801: T1 · axial · 4.0mm · 0.38mm/px · z∈[-30,+203]mm · 6 of 71 slices shown (2 of 2)]
[im 1/71]
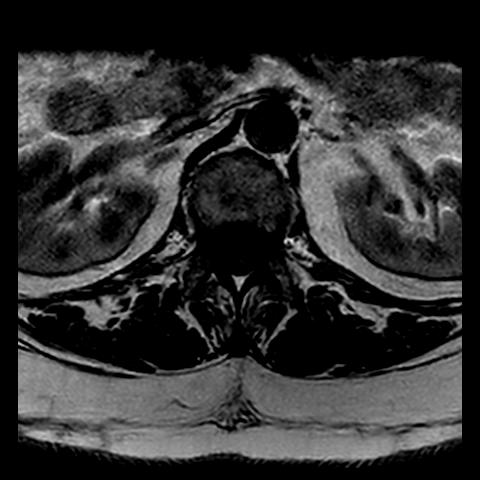
[im 15/71]
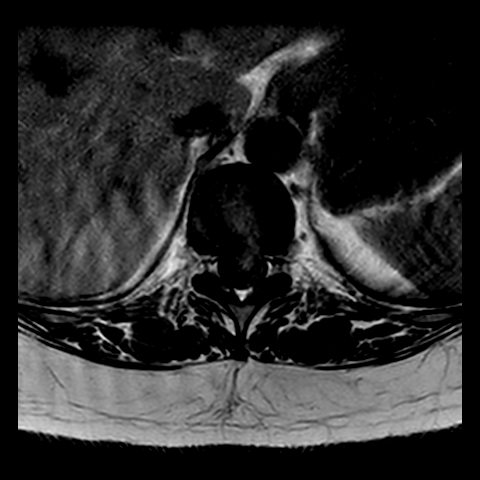
[im 29/71]
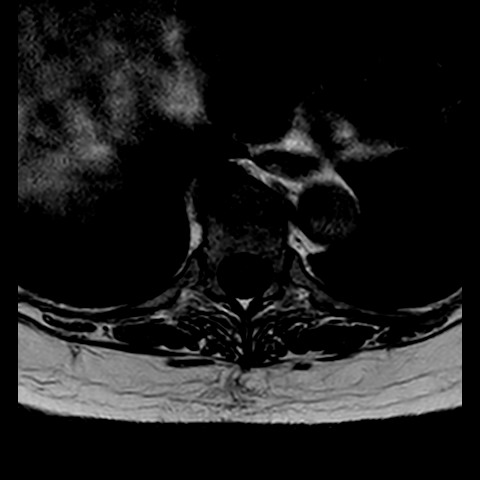
[im 43/71]
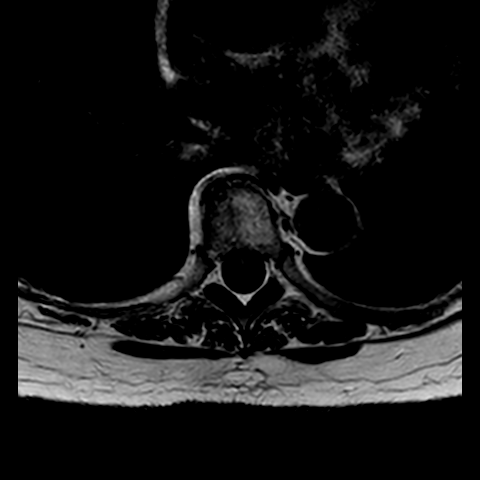
[im 57/71]
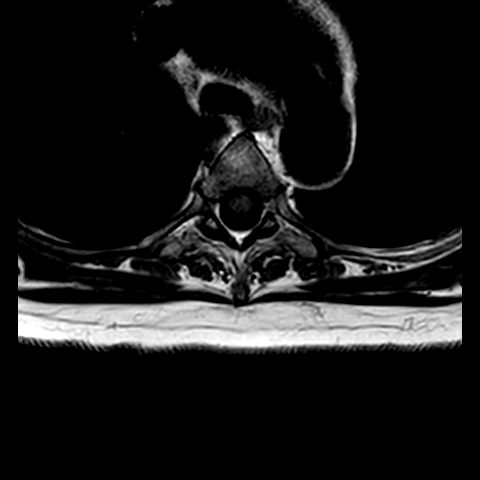
[im 71/71]
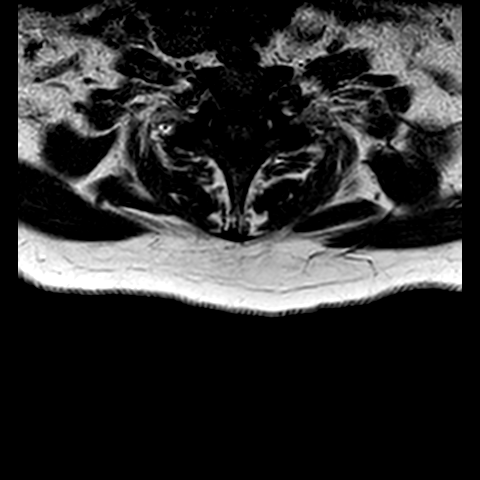

[Series 1901: T1 post-contrast · axial · 4.0mm · 0.38mm/px · z∈[-30,+145]mm · 5 of 71 slices shown]
[im 1/71]
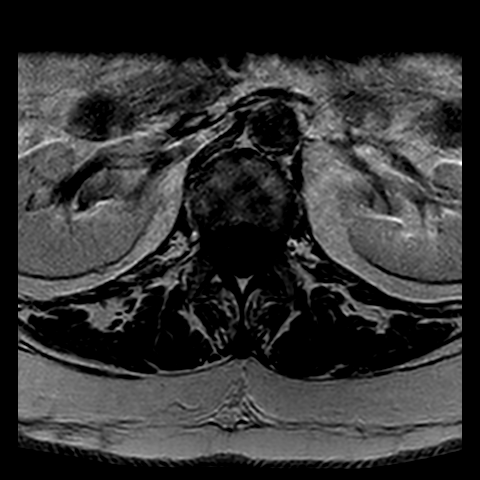
[im 15/71]
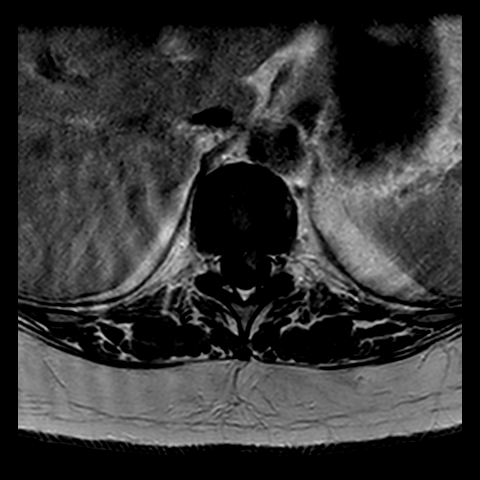
[im 29/71]
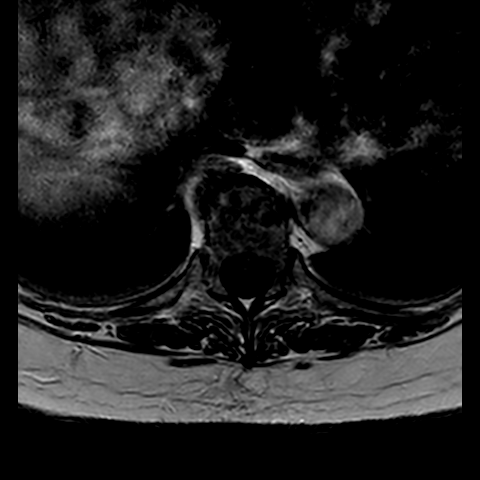
[im 43/71]
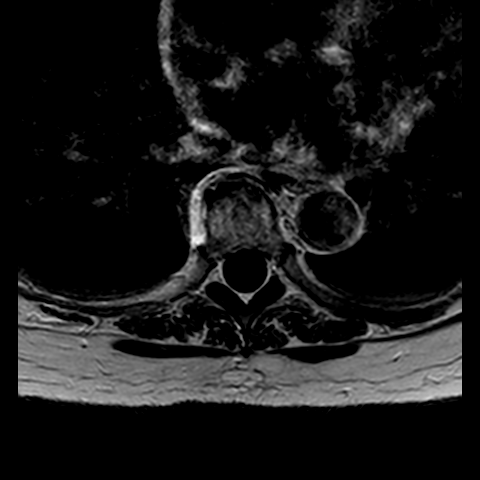
[im 57/71]
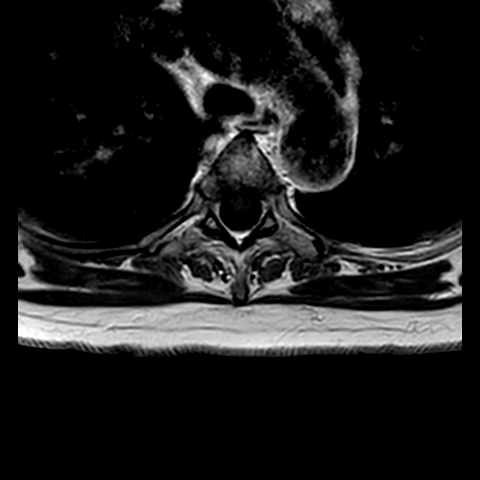

[20 of 48 positions shown; findings below may reference images not displayed]

FINDINGS: 12 thoracic and 5 lumbar type vertebral bodies. 
-------------------------------------------------------------------------------- 
------ 
GENERAL: 
ALIGNMENT: Normal coronal alignment. Accentuated thoracic kyphosis. Otherwise 
there is anatomic sagittal alignment. 
VERTEBRAL BODY HEIGHT: Normal.  
MARROW SIGNAL: 8mm enhancing lesion within the T5 vertebral body is stable. 10 
mm enhancing lesion involving the left T12 pedicle is also stable. Stable 7-8mm 
enhancing lesion within the T12 spinous process. There is also a stable 7 mm 
enhancing lesion within L1. 
CORD SIGNAL: Normal. 
ADDITIONAL FINDINGS: There are nonspecific regions of prominent signal intensity 
within the right lung.. 
-------------------------------------------------------------------------------- 
------ 
RELEVANT SEGMENTAL (levels with severe stenosis or significant findings): 
T6-T7: Central disc extrusion with disc material extending superior and inferior 
endplate distorts the ventral cord margin with mild canal stenosis. Foramina 
patent. Stable. 
There are minimal annular bulges at other levels but, but none of which abut or 
distorts the ventral cord margin. 
Additional scattered discogenic/degenerative changes are noted. 
-------------------------------------------------------------------------------- 
------
IMPRESSION: 3 separate stable, indeterminate, enhancing lesions within the thoracic spine. 
Again, could correlate with dedicated CT thoracic spine to evaluate the osseous 
architecture and bone scan.

## 2023-02-05 IMAGING — MR MRI BREAST BILATERAL W/WO CONTRAST
5 of 14 series · 12 of 48 positions shown · IV contrast (gadolinium)
Comparison: None prior of the breast.

Referring: MEJIDZADE, MEYXAN                
________________________________________________________________________________________________ 
MRI BREAST BILATERAL W/WO CONTRAST, 02/05/2023 [DATE]: 
CLINICAL INDICATION: Autoimmune thyroiditis. Malignant neoplasm of thyroid 
gland. History of breast cancer with bilateral mastectomy and breast 
reconstruction. History of melanoma.
TECHNIQUE: Multiple sequences were obtained in various planes with both before 
and after the intravenous administration of gadolinium. In addition to the 
routine images, three-dimensional renderings were performed on an independent 
workstation, time activity curves generated over areas of enhancement and 
computer-aided detection utilized. 7.5 mL of gadolinium Gadavist were injected 
intravenously. Patient was scanned on a 3T magnet.

[Series 201: survey · axial · 7.0mm · 1.34mm/px · 1 of 25 slices shown]
[im 1/25]
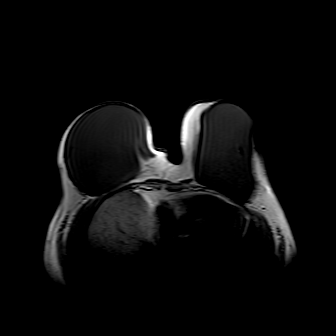

[Series 301: t1w_ffe_3d cs · axial · 1.6mm · 0.50mm/px · z∈[-82,+117]mm · 3 of 214 slices shown]
[im 1/214]
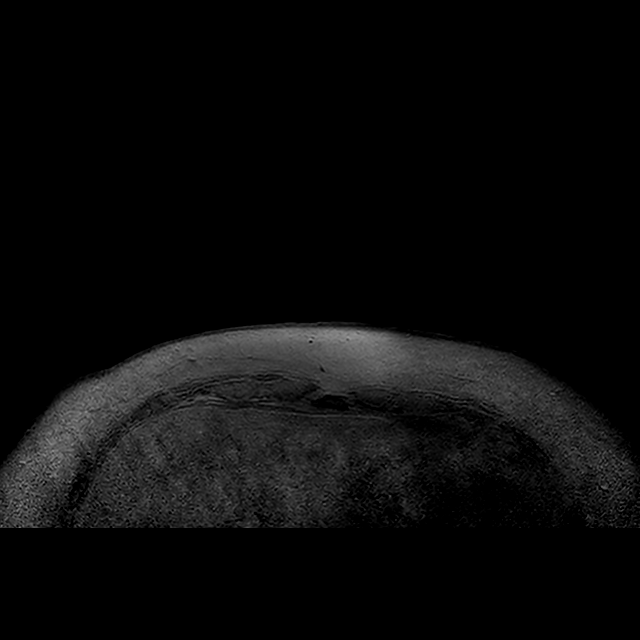
[im 107/214]
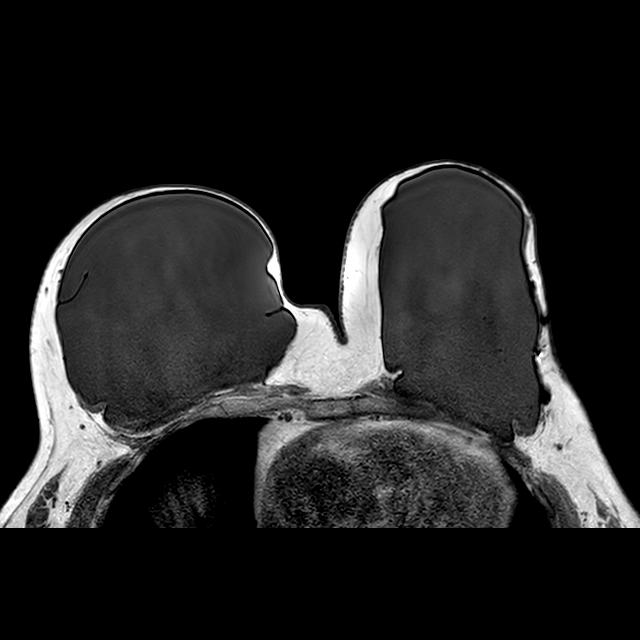
[im 214/214]
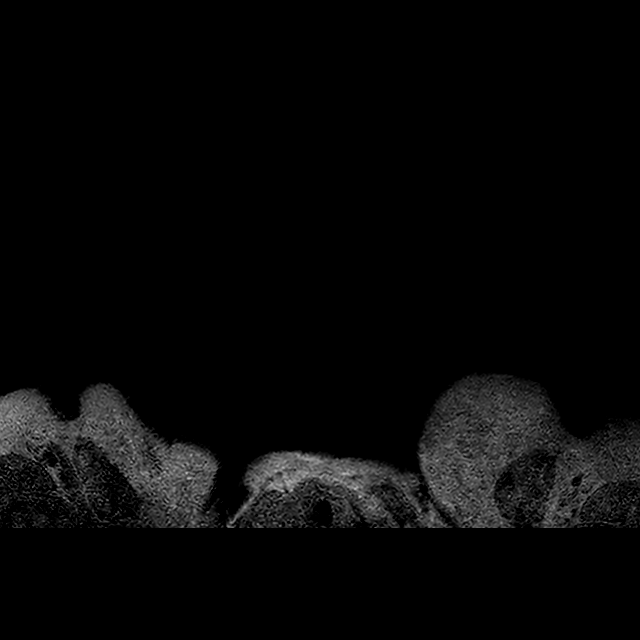

[Series 401: t2w_(id) · axial · 2.5mm · 0.63mm/px · 1 of 80 slices shown]
[im 1/80]
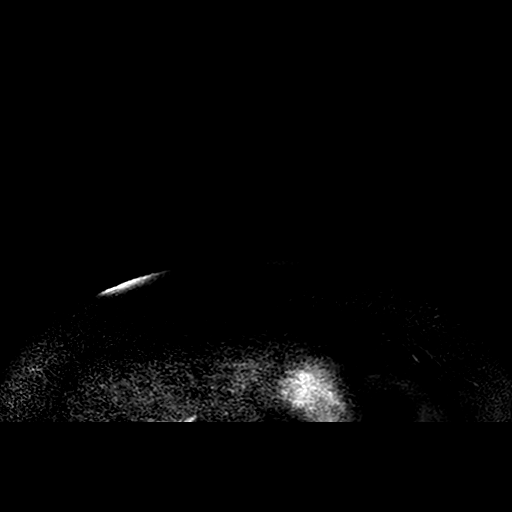

[Series 502: ssub dyn 1 · axial · 1.6mm · 0.46mm/px · z∈[-82,+117]mm · 4 of 250 slices shown]
[im 1/250]
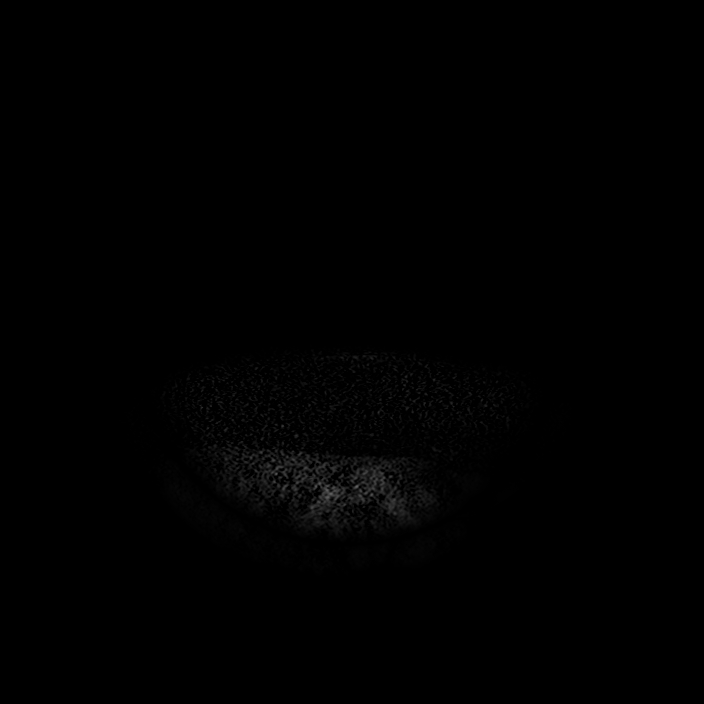
[im 84/250]
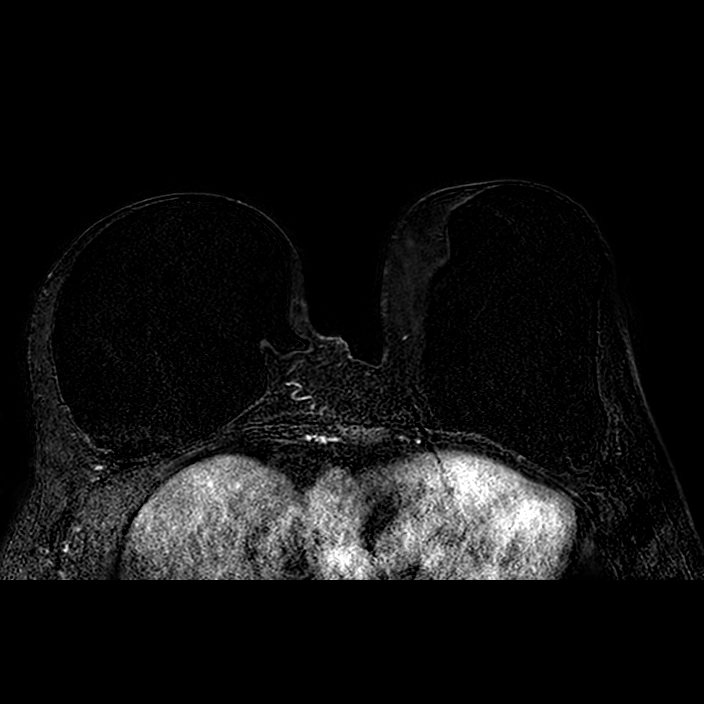
[im 167/250]
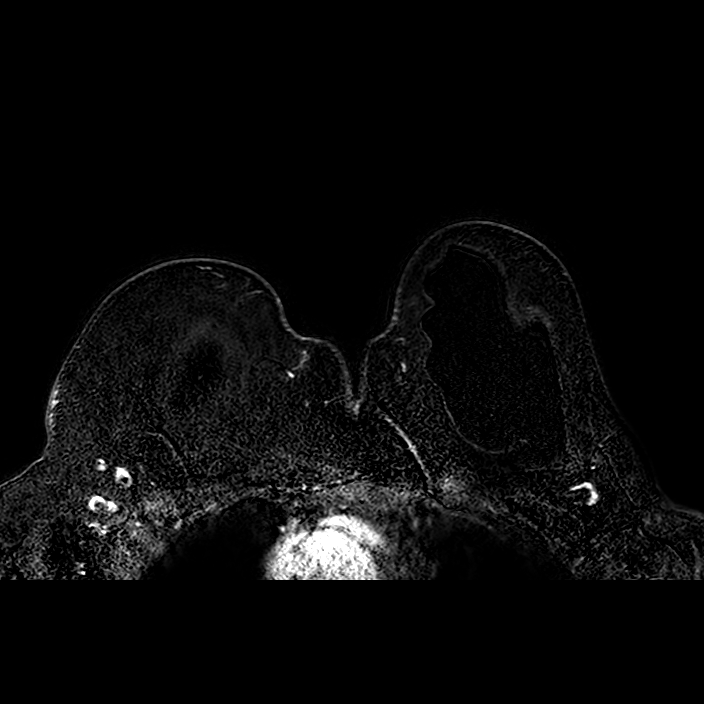
[im 250/250]
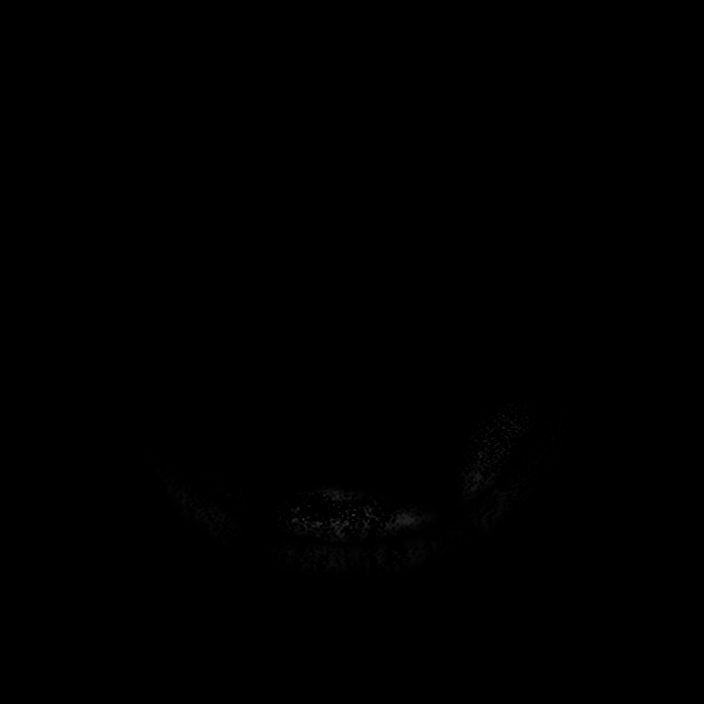

[Series 503: ssub dyn 2 · axial · 1.6mm · 0.46mm/px · z∈[-82,+17]mm · 3 of 250 slices shown]
[im 1/250]
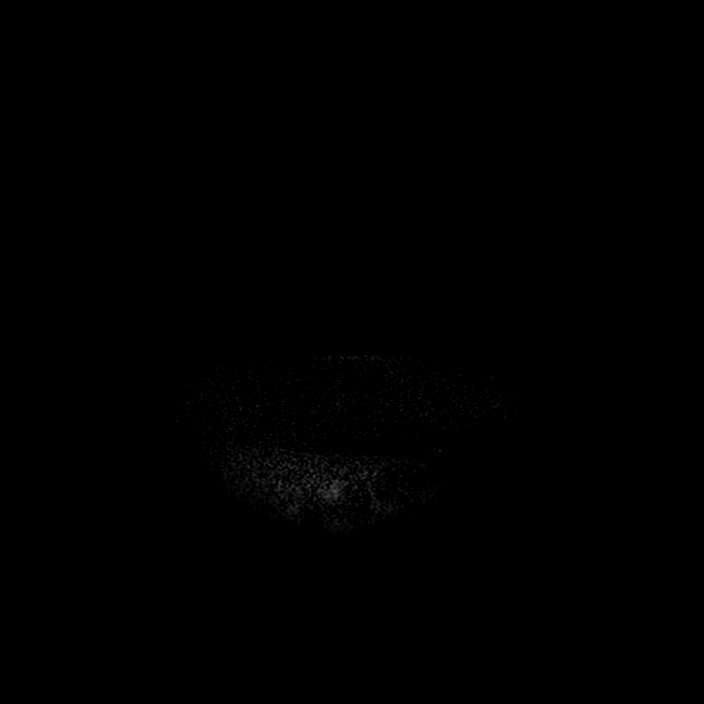
[im 63/250]
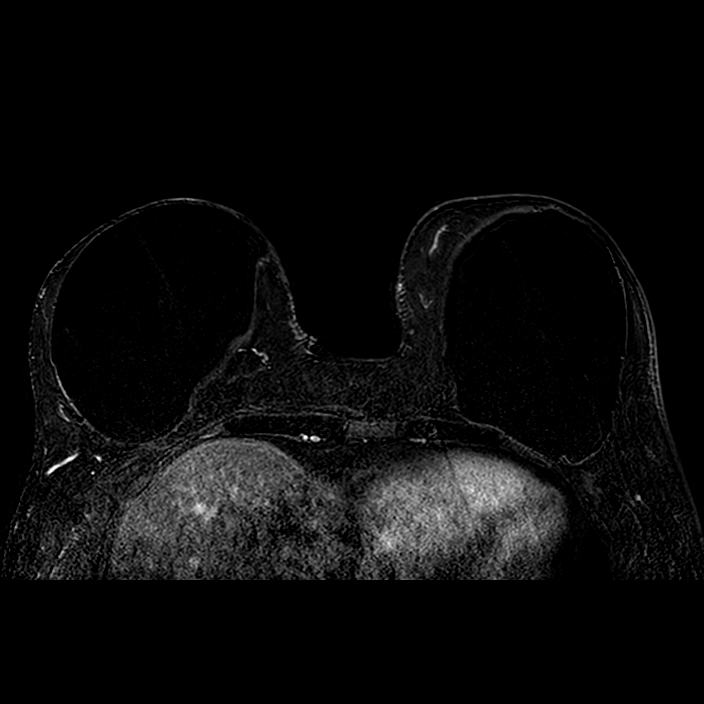
[im 125/250]
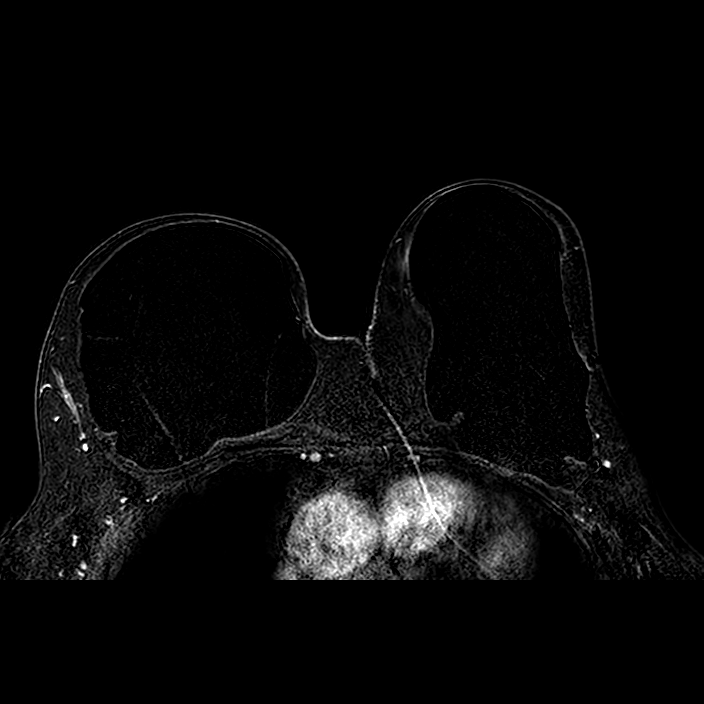

[12 of 48 positions shown; findings below may reference images not displayed]

FINDINGS: Minimal background parenchymal enhancement. 
Right breast: Postsurgical changes of mastectomy with implant reconstruction. No 
periimplant fluid collections. No evidence of intra or extracapsular rupture.  
No abnormal areas of enhancement. No areas of enhancement meeting threshold 
criteria on CAD analysis. Small nonspecific right axillary lymph nodes.  No 
internal mammary nodes. 
Left breast: Postsurgical changes of mastectomy with implant reconstruction. No 
periimplant fluid collections. No evidence of intra or extracapsular rupture. No 
abnormal areas of enhancement. No areas of enhancement meeting threshold 
criteria on CAD analysis. Small nonspecific left axillary lymph nodes.  No 
internal mammary nodes.
IMPRESSION: No MR findings suggestive for malignancy. 
(BI-RADS 2) Benign findings. Routine mammographic follow-up is recommended.
